# Patient Record
Sex: Female | Born: 1950 | Race: White | Hispanic: No | Smoking: Never smoker
Health system: Southern US, Community
[De-identification: ages and names within clinical notes are randomized; demographics above are authoritative.]

## PROBLEM LIST (undated history)

## (undated) DIAGNOSIS — M1990 Unspecified osteoarthritis, unspecified site: Secondary | ICD-10-CM

## (undated) DIAGNOSIS — C50919 Malignant neoplasm of unspecified site of unspecified female breast: Secondary | ICD-10-CM

## (undated) HISTORY — DX: Unspecified osteoarthritis, unspecified site: M19.90

## (undated) HISTORY — DX: Malignant neoplasm of unspecified site of unspecified female breast: C50.919

---

## 2002-05-06 ENCOUNTER — Emergency Department (HOSPITAL_COMMUNITY): Admission: EM | Admit: 2002-05-06 | Discharge: 2002-05-06 | Payer: Self-pay | Admitting: Emergency Medicine

## 2002-05-18 ENCOUNTER — Emergency Department (HOSPITAL_COMMUNITY): Admission: EM | Admit: 2002-05-18 | Discharge: 2002-05-18 | Payer: Self-pay | Admitting: Emergency Medicine

## 2002-07-19 ENCOUNTER — Encounter: Admission: RE | Admit: 2002-07-19 | Discharge: 2002-10-17 | Payer: Self-pay | Admitting: Family Medicine

## 2002-10-21 ENCOUNTER — Ambulatory Visit (HOSPITAL_COMMUNITY): Admission: RE | Admit: 2002-10-21 | Discharge: 2002-10-21 | Payer: Self-pay | Admitting: Gastroenterology

## 2003-07-07 ENCOUNTER — Other Ambulatory Visit: Admission: RE | Admit: 2003-07-07 | Discharge: 2003-07-07 | Payer: Self-pay | Admitting: Family Medicine

## 2005-06-16 ENCOUNTER — Other Ambulatory Visit: Admission: RE | Admit: 2005-06-16 | Discharge: 2005-06-16 | Payer: Self-pay | Admitting: Family Medicine

## 2006-07-07 ENCOUNTER — Other Ambulatory Visit: Admission: RE | Admit: 2006-07-07 | Discharge: 2006-07-07 | Payer: Self-pay | Admitting: Family Medicine

## 2006-07-23 ENCOUNTER — Encounter: Admission: RE | Admit: 2006-07-23 | Discharge: 2006-07-23 | Payer: Self-pay | Admitting: Family Medicine

## 2008-05-19 ENCOUNTER — Other Ambulatory Visit: Admission: RE | Admit: 2008-05-19 | Discharge: 2008-05-19 | Payer: Self-pay | Admitting: Obstetrics and Gynecology

## 2009-04-08 ENCOUNTER — Encounter: Admission: RE | Admit: 2009-04-08 | Discharge: 2009-04-08 | Payer: Self-pay | Admitting: Radiology

## 2009-04-20 ENCOUNTER — Encounter: Admission: RE | Admit: 2009-04-20 | Discharge: 2009-04-20 | Payer: Self-pay | Admitting: Surgery

## 2009-04-25 ENCOUNTER — Ambulatory Visit (HOSPITAL_BASED_OUTPATIENT_CLINIC_OR_DEPARTMENT_OTHER): Admission: RE | Admit: 2009-04-25 | Discharge: 2009-04-25 | Payer: Self-pay | Admitting: Surgery

## 2009-04-25 HISTORY — PX: BREAST LUMPECTOMY: SHX2

## 2009-05-02 ENCOUNTER — Ambulatory Visit: Payer: Self-pay | Admitting: Oncology

## 2009-05-16 LAB — COMPREHENSIVE METABOLIC PANEL
AST: 19 U/L (ref 0–37)
BUN: 13 mg/dL (ref 6–23)
Calcium: 9.4 mg/dL (ref 8.4–10.5)
Glucose, Bld: 104 mg/dL — ABNORMAL HIGH (ref 70–99)
Sodium: 139 mEq/L (ref 135–145)

## 2009-05-16 LAB — CBC WITH DIFFERENTIAL/PLATELET
BASO%: 0.4 % (ref 0.0–2.0)
Basophils Absolute: 0 10*3/uL (ref 0.0–0.1)
HGB: 14.1 g/dL (ref 11.6–15.9)
MCH: 31.5 pg (ref 25.1–34.0)
MCHC: 34.1 g/dL (ref 31.5–36.0)
MONO#: 0.3 10*3/uL (ref 0.1–0.9)
NEUT%: 61.7 % (ref 38.4–76.8)
WBC: 6.1 10*3/uL (ref 3.9–10.3)
lymph#: 1.8 10*3/uL (ref 0.9–3.3)

## 2009-05-21 ENCOUNTER — Ambulatory Visit: Admission: RE | Admit: 2009-05-21 | Discharge: 2009-05-26 | Payer: Self-pay | Admitting: Radiation Oncology

## 2009-05-30 ENCOUNTER — Ambulatory Visit (HOSPITAL_COMMUNITY): Admission: RE | Admit: 2009-05-30 | Discharge: 2009-05-30 | Payer: Self-pay | Admitting: Oncology

## 2009-06-01 ENCOUNTER — Ambulatory Visit: Payer: Self-pay | Admitting: Oncology

## 2009-06-08 LAB — CBC WITH DIFFERENTIAL/PLATELET
Basophils Absolute: 0 10*3/uL (ref 0.0–0.1)
Eosinophils Absolute: 0.1 10*3/uL (ref 0.0–0.5)
HCT: 39.9 % (ref 34.8–46.6)
MCH: 31.2 pg (ref 25.1–34.0)
MCHC: 34.2 g/dL (ref 31.5–36.0)
MCV: 91.1 fL (ref 79.5–101.0)
NEUT#: 10.3 10*3/uL — ABNORMAL HIGH (ref 1.5–6.5)
NEUT%: 82.2 % — ABNORMAL HIGH (ref 38.4–76.8)
Platelets: 134 10*3/uL — ABNORMAL LOW (ref 145–400)
WBC: 12.5 10*3/uL — ABNORMAL HIGH (ref 3.9–10.3)

## 2009-06-08 LAB — BASIC METABOLIC PANEL
BUN: 11 mg/dL (ref 6–23)
CO2: 22 mEq/L (ref 19–32)
Calcium: 9 mg/dL (ref 8.4–10.5)
Creatinine, Ser: 0.66 mg/dL (ref 0.40–1.20)
Potassium: 3.8 mEq/L (ref 3.5–5.3)
Sodium: 136 mEq/L (ref 135–145)

## 2009-06-08 LAB — TECHNOLOGIST REVIEW

## 2009-06-22 LAB — CBC WITH DIFFERENTIAL/PLATELET
Basophils Absolute: 0 10*3/uL (ref 0.0–0.1)
EOS%: 0 % (ref 0.0–7.0)
MCH: 30.6 pg (ref 25.1–34.0)
MCHC: 34 g/dL (ref 31.5–36.0)
MONO#: 0.2 10*3/uL (ref 0.1–0.9)
MONO%: 1.3 % (ref 0.0–14.0)
NEUT%: 91.9 % — ABNORMAL HIGH (ref 38.4–76.8)

## 2009-06-22 LAB — COMPREHENSIVE METABOLIC PANEL
AST: 18 U/L (ref 0–37)
Albumin: 4.2 g/dL (ref 3.5–5.2)
Chloride: 105 mEq/L (ref 96–112)
Potassium: 4.4 mEq/L (ref 3.5–5.3)
Sodium: 135 mEq/L (ref 135–145)

## 2009-06-22 LAB — URINALYSIS, MICROSCOPIC - CHCC
Bilirubin (Urine): NEGATIVE
Blood: NEGATIVE
Glucose: NEGATIVE g/dL
Ketones: NEGATIVE mg/dL
Leukocyte Esterase: NEGATIVE
Nitrite: NEGATIVE
Specific Gravity, Urine: 1.025 (ref 1.003–1.035)

## 2009-06-29 LAB — CBC WITH DIFFERENTIAL/PLATELET
Basophils Absolute: 0.1 10*3/uL (ref 0.0–0.1)
Eosinophils Absolute: 0.3 10*3/uL (ref 0.0–0.5)
HGB: 13.2 g/dL (ref 11.6–15.9)
MCH: 30.6 pg (ref 25.1–34.0)
MCHC: 33.8 g/dL (ref 31.5–36.0)
MCV: 90.5 fL (ref 79.5–101.0)
MONO#: 1.5 10*3/uL — ABNORMAL HIGH (ref 0.1–0.9)
NEUT#: 5.7 10*3/uL (ref 1.5–6.5)
Platelets: 166 10*3/uL (ref 145–400)
RDW: 14.2 % (ref 11.2–14.5)
lymph#: 1.3 10*3/uL (ref 0.9–3.3)

## 2009-07-11 ENCOUNTER — Ambulatory Visit: Payer: Self-pay | Admitting: Oncology

## 2009-07-13 LAB — CBC WITH DIFFERENTIAL/PLATELET
Basophils Absolute: 0 10*3/uL (ref 0.0–0.1)
Eosinophils Absolute: 0 10*3/uL (ref 0.0–0.5)
HGB: 12.6 g/dL (ref 11.6–15.9)
LYMPH%: 5 % — ABNORMAL LOW (ref 14.0–49.7)
MONO#: 0.2 10*3/uL (ref 0.1–0.9)
NEUT#: 14.1 10*3/uL — ABNORMAL HIGH (ref 1.5–6.5)
RBC: 4.1 10*6/uL (ref 3.70–5.45)
RDW: 15.9 % — ABNORMAL HIGH (ref 11.2–14.5)
WBC: 15 10*3/uL — ABNORMAL HIGH (ref 3.9–10.3)
lymph#: 0.8 10*3/uL — ABNORMAL LOW (ref 0.9–3.3)

## 2009-07-13 LAB — URINALYSIS, MICROSCOPIC - CHCC
Bilirubin (Urine): NEGATIVE
Glucose: NEGATIVE g/dL
Ketones: NEGATIVE mg/dL
Leukocyte Esterase: NEGATIVE
Nitrite: NEGATIVE
Protein: NEGATIVE mg/dL
Specific Gravity, Urine: 1.01 (ref 1.003–1.035)
pH: 6 (ref 4.6–8.0)

## 2009-07-13 LAB — COMPREHENSIVE METABOLIC PANEL WITH GFR
ALT: 36 U/L — ABNORMAL HIGH (ref 0–35)
AST: 27 U/L (ref 0–37)
Albumin: 3.8 g/dL (ref 3.5–5.2)
Alkaline Phosphatase: 65 U/L (ref 39–117)
BUN: 13 mg/dL (ref 6–23)
CO2: 23 meq/L (ref 19–32)
Calcium: 9.1 mg/dL (ref 8.4–10.5)
Chloride: 109 meq/L (ref 96–112)
Creatinine, Ser: 0.56 mg/dL (ref 0.40–1.20)
Glucose, Bld: 155 mg/dL — ABNORMAL HIGH (ref 70–99)
Potassium: 4.2 meq/L (ref 3.5–5.3)
Sodium: 138 meq/L (ref 135–145)
Total Bilirubin: 0.9 mg/dL (ref 0.3–1.2)
Total Protein: 6.2 g/dL (ref 6.0–8.3)

## 2009-07-20 LAB — CBC WITH DIFFERENTIAL/PLATELET
BASO%: 1.1 % (ref 0.0–2.0)
Basophils Absolute: 0.1 10*3/uL (ref 0.0–0.1)
Eosinophils Absolute: 0.2 10*3/uL (ref 0.0–0.5)
HCT: 36.9 % (ref 34.8–46.6)
HGB: 12.4 g/dL (ref 11.6–15.9)
NEUT#: 4.2 10*3/uL (ref 1.5–6.5)
Platelets: 148 10*3/uL (ref 145–400)
RBC: 4.04 10*6/uL (ref 3.70–5.45)
RDW: 15.6 % — ABNORMAL HIGH (ref 11.2–14.5)
WBC: 6.6 10*3/uL (ref 3.9–10.3)

## 2009-08-03 LAB — CBC WITH DIFFERENTIAL/PLATELET
Basophils Absolute: 0 10*3/uL (ref 0.0–0.1)
LYMPH%: 4.2 % — ABNORMAL LOW (ref 14.0–49.7)
MCHC: 33.5 g/dL (ref 31.5–36.0)
MONO#: 0.4 10*3/uL (ref 0.1–0.9)
NEUT#: 12 10*3/uL — ABNORMAL HIGH (ref 1.5–6.5)
Platelets: 242 10*3/uL (ref 145–400)
RDW: 17.6 % — ABNORMAL HIGH (ref 11.2–14.5)

## 2009-08-03 LAB — COMPREHENSIVE METABOLIC PANEL
AST: 24 U/L (ref 0–37)
Alkaline Phosphatase: 62 U/L (ref 39–117)
BUN: 10 mg/dL (ref 6–23)
CO2: 21 mEq/L (ref 19–32)
Calcium: 9.4 mg/dL (ref 8.4–10.5)
Potassium: 4.3 mEq/L (ref 3.5–5.3)
Total Bilirubin: 0.6 mg/dL (ref 0.3–1.2)
Total Protein: 6.4 g/dL (ref 6.0–8.3)

## 2009-08-09 ENCOUNTER — Ambulatory Visit: Admission: RE | Admit: 2009-08-09 | Discharge: 2009-10-29 | Payer: Self-pay | Admitting: Radiation Oncology

## 2009-08-10 ENCOUNTER — Ambulatory Visit: Payer: Self-pay | Admitting: Oncology

## 2009-08-10 LAB — CBC WITH DIFFERENTIAL/PLATELET
EOS%: 2.4 % (ref 0.0–7.0)
HCT: 34.3 % — ABNORMAL LOW (ref 34.8–46.6)
HGB: 11.6 g/dL (ref 11.6–15.9)
MCH: 31.4 pg (ref 25.1–34.0)
MONO%: 24.5 % — ABNORMAL HIGH (ref 0.0–14.0)
NEUT#: 2.1 10*3/uL (ref 1.5–6.5)
Platelets: 121 10*3/uL — ABNORMAL LOW (ref 145–400)

## 2009-08-30 ENCOUNTER — Ambulatory Visit (HOSPITAL_COMMUNITY): Admission: RE | Admit: 2009-08-30 | Discharge: 2009-08-30 | Payer: Self-pay | Admitting: Oncology

## 2009-10-15 ENCOUNTER — Ambulatory Visit: Payer: Self-pay | Admitting: Oncology

## 2009-10-17 LAB — CBC WITH DIFFERENTIAL/PLATELET
BASO%: 0.2 % (ref 0.0–2.0)
Basophils Absolute: 0 10e3/uL (ref 0.0–0.1)
EOS%: 3.1 % (ref 0.0–7.0)
Eosinophils Absolute: 0.1 10e3/uL (ref 0.0–0.5)
HCT: 39.9 % (ref 34.8–46.6)
HGB: 13.8 g/dL (ref 11.6–15.9)
LYMPH%: 16.4 % (ref 14.0–49.7)
MCH: 32.1 pg (ref 25.1–34.0)
MCHC: 34.6 g/dL (ref 31.5–36.0)
MCV: 92.8 fL (ref 79.5–101.0)
MONO#: 0.3 10e3/uL (ref 0.1–0.9)
MONO%: 8.5 % (ref 0.0–14.0)
NEUT#: 2.8 10e3/uL (ref 1.5–6.5)
NEUT%: 71.8 % (ref 38.4–76.8)
Platelets: 200 10e3/uL (ref 145–400)
RBC: 4.3 10e6/uL (ref 3.70–5.45)
RDW: 12.6 % (ref 11.2–14.5)
WBC: 3.9 10e3/uL (ref 3.9–10.3)
lymph#: 0.6 10e3/uL — ABNORMAL LOW (ref 0.9–3.3)

## 2009-10-17 LAB — COMPREHENSIVE METABOLIC PANEL
AST: 25 U/L (ref 0–37)
Alkaline Phosphatase: 57 U/L (ref 39–117)
Creatinine, Ser: 0.58 mg/dL (ref 0.40–1.20)
Glucose, Bld: 98 mg/dL (ref 70–99)
Total Bilirubin: 0.7 mg/dL (ref 0.3–1.2)
Total Protein: 6.5 g/dL (ref 6.0–8.3)

## 2009-11-13 LAB — CBC WITH DIFFERENTIAL/PLATELET
Basophils Absolute: 0 10*3/uL (ref 0.0–0.1)
EOS%: 4.5 % (ref 0.0–7.0)
HCT: 41.1 % (ref 34.8–46.6)
HGB: 14.3 g/dL (ref 11.6–15.9)
LYMPH%: 19.6 % (ref 14.0–49.7)
RBC: 4.49 10*6/uL (ref 3.70–5.45)
RDW: 12.5 % (ref 11.2–14.5)
lymph#: 0.7 10*3/uL — ABNORMAL LOW (ref 0.9–3.3)

## 2009-11-13 LAB — COMPREHENSIVE METABOLIC PANEL
ALT: 18 U/L (ref 0–35)
Albumin: 4.4 g/dL (ref 3.5–5.2)
CO2: 25 mEq/L (ref 19–32)
Calcium: 9.6 mg/dL (ref 8.4–10.5)
Chloride: 107 mEq/L (ref 96–112)
Creatinine, Ser: 0.7 mg/dL (ref 0.40–1.20)
Potassium: 4.3 mEq/L (ref 3.5–5.3)
Sodium: 141 mEq/L (ref 135–145)

## 2009-12-19 ENCOUNTER — Other Ambulatory Visit: Admission: RE | Admit: 2009-12-19 | Discharge: 2009-12-19 | Payer: Self-pay | Admitting: Family Medicine

## 2010-02-11 ENCOUNTER — Ambulatory Visit: Payer: Self-pay | Admitting: Oncology

## 2010-03-27 ENCOUNTER — Ambulatory Visit: Payer: Self-pay | Admitting: Oncology

## 2010-04-02 ENCOUNTER — Ambulatory Visit: Payer: Self-pay | Admitting: Oncology

## 2010-04-02 LAB — CBC WITH DIFFERENTIAL/PLATELET
BASO%: 0.3 % (ref 0.0–2.0)
Basophils Absolute: 0 10*3/uL (ref 0.0–0.1)
Eosinophils Absolute: 0.2 10*3/uL (ref 0.0–0.5)
HGB: 13.2 g/dL (ref 11.6–15.9)
LYMPH%: 22.8 % (ref 14.0–49.7)
MCV: 92.6 fL (ref 79.5–101.0)
MONO%: 6.9 % (ref 0.0–14.0)
NEUT%: 66.2 % (ref 38.4–76.8)
Platelets: 204 10*3/uL (ref 145–400)
lymph#: 1.1 10*3/uL (ref 0.9–3.3)

## 2010-04-02 LAB — COMPREHENSIVE METABOLIC PANEL
ALT: 24 U/L (ref 0–35)
AST: 24 U/L (ref 0–37)
Albumin: 3.9 g/dL (ref 3.5–5.2)
Alkaline Phosphatase: 68 U/L (ref 39–117)
BUN: 12 mg/dL (ref 6–23)
CO2: 27 mEq/L (ref 19–32)
Calcium: 9 mg/dL (ref 8.4–10.5)
Chloride: 107 mEq/L (ref 96–112)
Creatinine, Ser: 0.7 mg/dL (ref 0.40–1.20)
Glucose, Bld: 90 mg/dL (ref 70–99)
Potassium: 3.8 mEq/L (ref 3.5–5.3)
Sodium: 140 mEq/L (ref 135–145)
Total Bilirubin: 0.5 mg/dL (ref 0.3–1.2)
Total Protein: 6.2 g/dL (ref 6.0–8.3)

## 2010-04-02 LAB — VITAMIN D 25 HYDROXY (VIT D DEFICIENCY, FRACTURES): Vit D, 25-Hydroxy: 14 ng/mL — ABNORMAL LOW (ref 30–89)

## 2010-04-02 LAB — CANCER ANTIGEN 27.29: CA 27.29: 30 U/mL (ref 0–39)

## 2010-05-27 ENCOUNTER — Ambulatory Visit: Payer: No Typology Code available for payment source | Attending: Radiation Oncology | Admitting: Radiation Oncology

## 2010-06-16 LAB — URINALYSIS, ROUTINE W REFLEX MICROSCOPIC
Glucose, UA: NEGATIVE mg/dL
Ketones, ur: NEGATIVE mg/dL
Nitrite: NEGATIVE
Specific Gravity, Urine: 1.022 (ref 1.005–1.030)
pH: 6 (ref 5.0–8.0)

## 2010-06-16 LAB — CBC
Hemoglobin: 14.4 g/dL (ref 12.0–15.0)
MCHC: 34.8 g/dL (ref 30.0–36.0)
MCV: 91.6 fL (ref 78.0–100.0)
Platelets: 198 10*3/uL (ref 150–400)
RDW: 13.1 % (ref 11.5–15.5)
WBC: 5.4 10*3/uL (ref 4.0–10.5)

## 2010-06-16 LAB — URINE MICROSCOPIC-ADD ON

## 2010-06-16 LAB — COMPREHENSIVE METABOLIC PANEL
AST: 25 U/L (ref 0–37)
BUN: 10 mg/dL (ref 6–23)
CO2: 25 mEq/L (ref 19–32)
Calcium: 9 mg/dL (ref 8.4–10.5)
GFR calc Af Amer: >60 mL/min (ref >60)
Glucose, Bld: 110 mg/dL — ABNORMAL HIGH (ref 70–99)
Potassium: 4.5 mEq/L (ref 3.5–5.1)
Total Protein: 6.7 g/dL (ref 6.0–8.3)

## 2010-06-16 LAB — DIFFERENTIAL
Basophils Absolute: 0 10*3/uL (ref 0.0–0.1)
Basophils Relative: 1 % (ref 0–1)
Eosinophils Absolute: 0.1 10*3/uL (ref 0.0–0.7)
Monocytes Relative: 6 % (ref 3–12)
Neutro Abs: 3.4 10*3/uL (ref 1.7–7.7)
Neutrophils Relative %: 63 % (ref 43–77)

## 2010-08-16 NOTE — Op Note (Signed)
   NAME:  Holly Woodard, Holly Woodard                       ACCOUNT NO.:  [id number]   MEDICAL RECORD NO.:  [id number]                   PATIENT TYPE:  AMB   LOCATION:  ENDO                                 FACILITY:  [facility]   PHYSICIAN:  Graylin Shiver, M.D.                DATE OF BIRTH:  Jan 16, 1951   DATE OF PROCEDURE:  10/21/2002  DATE OF DISCHARGE:                                 OPERATIVE REPORT   PROCEDURE:  Colonoscopy.   INDICATIONS FOR PROCEDURE:  Screening.   CONSENT:  Informed consent was obtained after explanation of the risks of  bleeding, infection and perforation.   PREMEDICATION:  Fentanyl 50 mcg IV, Versed 4 mg IV.   DESCRIPTION OF PROCEDURE:  With the patient in the left lateral decubitus  position, the rectal exam was performed and no masses were felt on digital  rectal examination.  The Olympus colonoscope was inserted into the rectum  and advanced around the colon to the cecum.  Cecal landmarks were  identified.  The cecum and ascending colon were normal.  The transverse  colon was normal.  The descending colon, sigmoid and rectum were normal.  She tolerated the procedure well without complications.   IMPRESSION:  Normal colonoscopy to the cecum with presence of some external  hemorrhoids.                                                Graylin Shiver, M.D.    SFG/MEDQ  D:  10/21/2002  T:  10/21/2002  Job:  [id number]   cc:   Chales Salmon. Abigail Miyamoto, M.D.  [address]  [place]  [place] [address]  Fax: [phone]

## 2010-10-28 ENCOUNTER — Other Ambulatory Visit: Payer: Self-pay | Admitting: Oncology

## 2010-10-28 ENCOUNTER — Encounter (HOSPITAL_BASED_OUTPATIENT_CLINIC_OR_DEPARTMENT_OTHER): Payer: PRIVATE HEALTH INSURANCE | Admitting: Oncology

## 2010-10-28 DIAGNOSIS — Z170 Estrogen receptor positive status [ER+]: Secondary | ICD-10-CM

## 2010-10-28 DIAGNOSIS — Z5111 Encounter for antineoplastic chemotherapy: Secondary | ICD-10-CM

## 2010-10-28 DIAGNOSIS — B370 Candidal stomatitis: Secondary | ICD-10-CM

## 2010-10-28 DIAGNOSIS — C50419 Malignant neoplasm of upper-outer quadrant of unspecified female breast: Secondary | ICD-10-CM

## 2010-10-28 LAB — CBC WITH DIFFERENTIAL/PLATELET
Eosinophils Absolute: 0.1 10*3/uL (ref 0.0–0.5)
HCT: 38 % (ref 34.8–46.6)
LYMPH%: 23.2 % (ref 14.0–49.7)
MONO#: 0.3 10*3/uL (ref 0.1–0.9)
NEUT#: 3.1 10*3/uL (ref 1.5–6.5)
Platelets: 192 10*3/uL (ref 145–400)
RBC: 4.13 10*6/uL (ref 3.70–5.45)
WBC: 4.6 10*3/uL (ref 3.9–10.3)

## 2010-10-28 LAB — COMPREHENSIVE METABOLIC PANEL
Albumin: 4.2 g/dL (ref 3.5–5.2)
CO2: 20 mEq/L (ref 19–32)
Calcium: 9.2 mg/dL (ref 8.4–10.5)
Glucose, Bld: 94 mg/dL (ref 70–99)
Sodium: 138 mEq/L (ref 135–145)
Total Bilirubin: 0.5 mg/dL (ref 0.3–1.2)
Total Protein: 6.5 g/dL (ref 6.0–8.3)

## 2010-12-11 ENCOUNTER — Other Ambulatory Visit: Payer: Self-pay | Admitting: Dermatology

## 2011-03-10 ENCOUNTER — Telehealth: Payer: Self-pay | Admitting: *Deleted

## 2011-03-10 NOTE — Telephone Encounter (Signed)
patient confirmed over the phone the new date and time on 05-08-2011 starting at 8:30am

## 2011-03-16 IMAGING — CR DG CHEST 2V
2 series · 2 of 2 positions shown · non-contrast
Comparison: 04/20/2009

CLINICAL DATA: Shortness of breath, history of breast cancer

CHEST - 2 VIEW

[w chest pa]
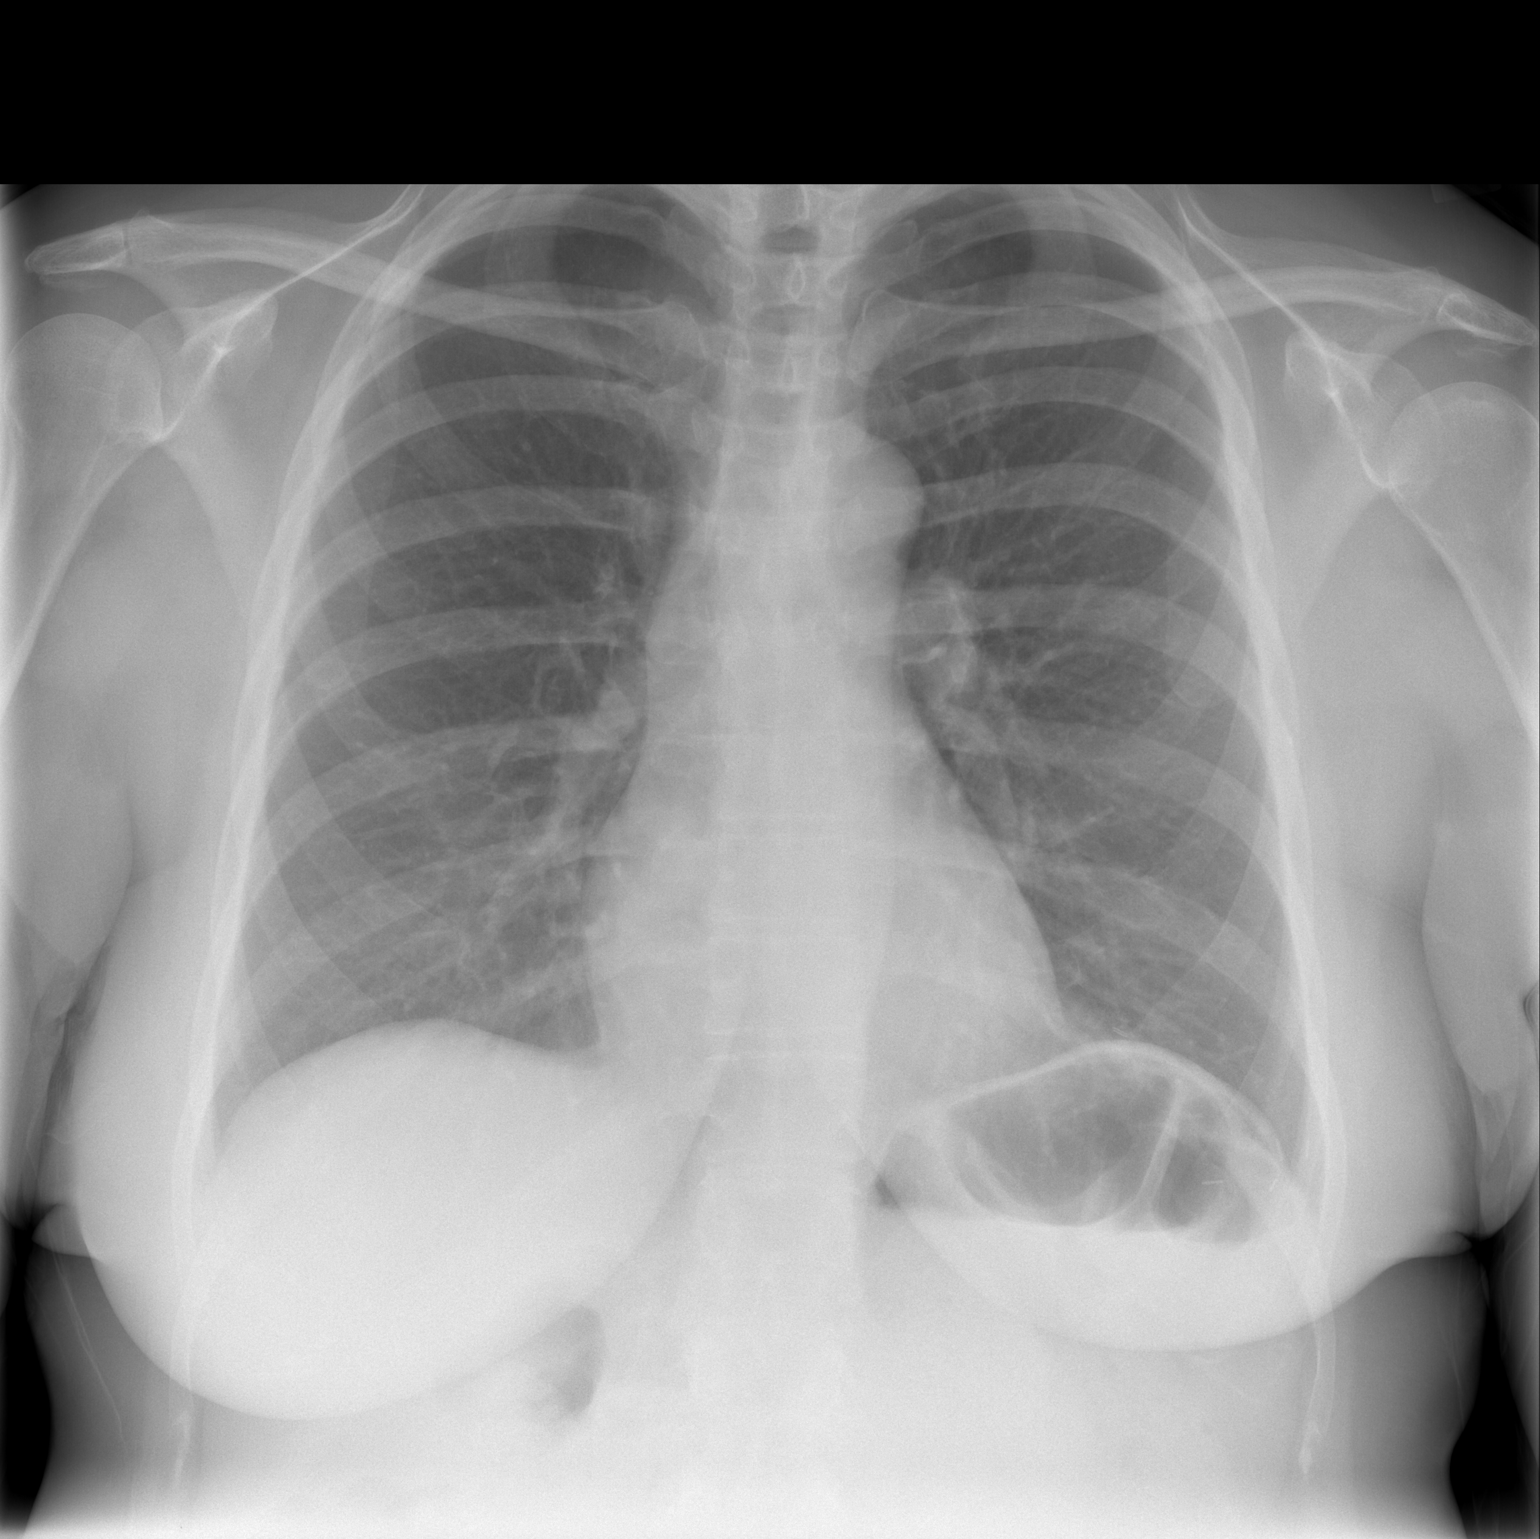

[w chest lat]
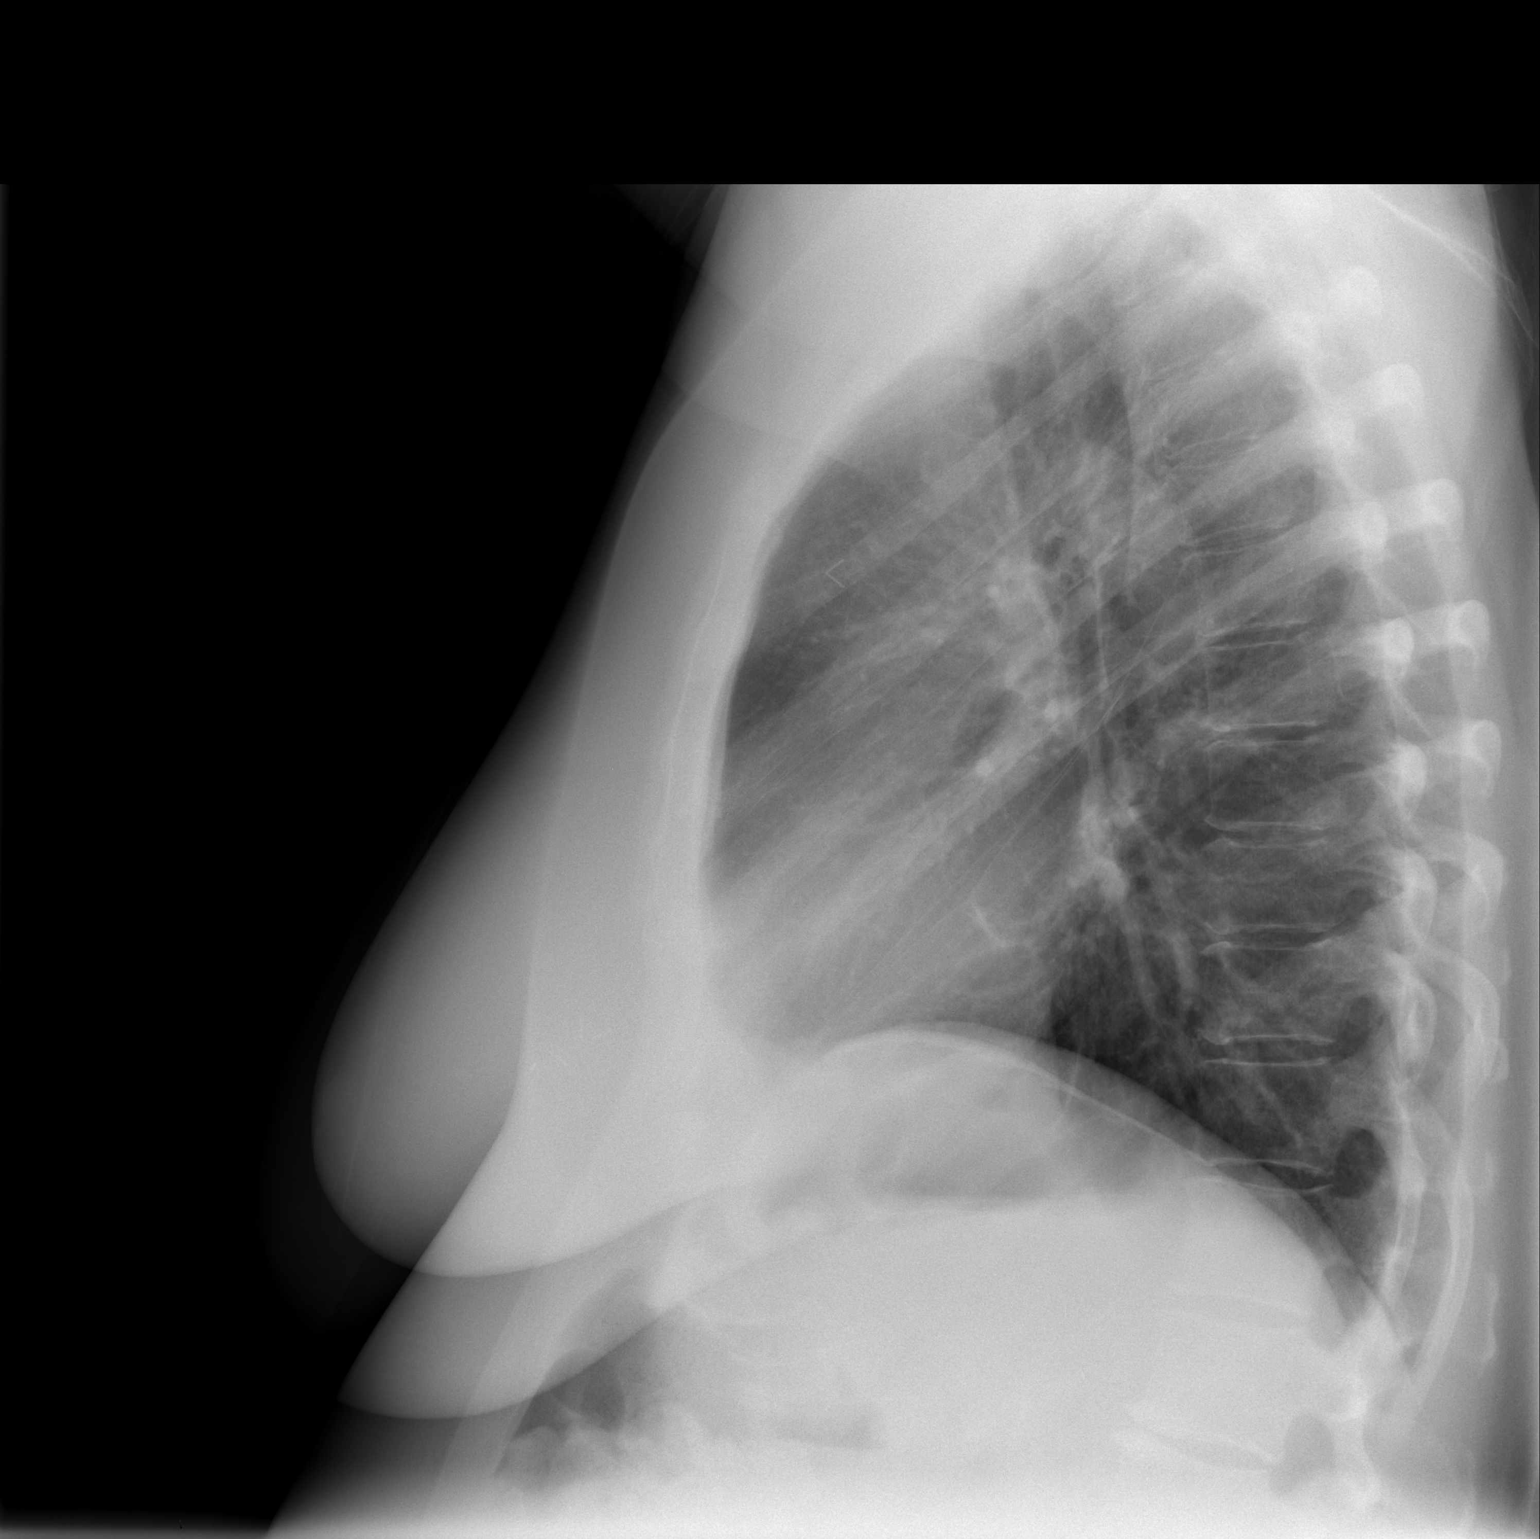

[2 of 2 positions shown; findings below may reference images not displayed]

FINDINGS: Normal heart size, mediastinal contours, and pulmonary vascularity.
Lungs clear.
Bones unremarkable.
No pneumothorax.
Few surgical clips noted at left breast / axilla
IMPRESSION: No acute abnormalities.

## 2011-04-30 ENCOUNTER — Ambulatory Visit: Payer: PRIVATE HEALTH INSURANCE | Admitting: Oncology

## 2011-04-30 ENCOUNTER — Other Ambulatory Visit: Payer: PRIVATE HEALTH INSURANCE | Admitting: Lab

## 2011-05-08 ENCOUNTER — Ambulatory Visit (HOSPITAL_BASED_OUTPATIENT_CLINIC_OR_DEPARTMENT_OTHER): Payer: BC Managed Care – PPO | Admitting: Oncology

## 2011-05-08 ENCOUNTER — Other Ambulatory Visit (HOSPITAL_BASED_OUTPATIENT_CLINIC_OR_DEPARTMENT_OTHER): Payer: BC Managed Care – PPO | Admitting: Lab

## 2011-05-08 VITALS — BP 169/90 | HR 75 | Temp 99.0°F | Ht 65.5 in | Wt 223.0 lb

## 2011-05-08 DIAGNOSIS — E559 Vitamin D deficiency, unspecified: Secondary | ICD-10-CM

## 2011-05-08 DIAGNOSIS — C50919 Malignant neoplasm of unspecified site of unspecified female breast: Secondary | ICD-10-CM

## 2011-05-08 DIAGNOSIS — Z170 Estrogen receptor positive status [ER+]: Secondary | ICD-10-CM

## 2011-05-08 NOTE — Progress Notes (Signed)
Hematology and Oncology Follow Up Visit  JENNIFERANN STUCKERT [id number] 1950-05-12 61 y.o. 05/08/2011 9:22 AM PCP [facility] [place]  Principle Diagnosis: 61 year old history of T1 C. N0 breast cancer ER/PR positive status post 4 cycles of TC chemotherapy radiation therapy completed 10/24/2009  Interim History:  There have been no intercurrent illness, hospitalizations or medication changes.  Medications: I have reviewed the patient's current medications.  Allergies: No Known Allergies  Past Medical History, Surgical history, Social history, and Family History were reviewed and updated.  Review of Systems: Constitutional:  Negative for fever, chills, night sweats, anorexia, weight loss, pain. Cardiovascular: no chest pain or dyspnea on exertion Respiratory: no cough, shortness of breath, or wheezing Neurological: no TIA or stroke symptoms Dermatological: negative ENT: negative Skin Gastrointestinal: negative Genito-Urinary: negative Hematological and Lymphatic: negative Breast: negative Musculoskeletal: migratory arthralgias Remaining ROS negative.  Physical Exam: Blood pressure 169/90, pulse 75, temperature 99 F (37.2 C), temperature source Oral, height 5' 5.5" (1.664 m), weight 223 lb (101.152 kg). ECOG: 0 General appearance: alert, cooperative and appears stated age Head: Normocephalic, without obvious abnormality, atraumatic Neck: no adenopathy, no carotid bruit, no JVD, supple, symmetrical, trachea midline and thyroid not enlarged, symmetric, no tenderness/mass/nodules Lymph nodes: Cervical, supraclavicular, and axillary nodes normal. Cardiac : regular rate and rhythm, no murmurs or gallops Pulmonary:clear to auscultation bilaterally and normal percussion bilaterally Breasts: inspection negative, no nipple discharge or bleeding, no masses or nodularity palpable Abdomen:soft, non-tender; bowel sounds normal; no masses,  no organomegaly Extremities  negative Neuro: alert, oriented, normal speech, no focal findings or movement disorder noted  Lab Results: Lab Results  Component Value Date   WBC 4.6 10/28/2010   HGB 13.1 10/28/2010   HCT 38.0 10/28/2010   MCV 92.1 10/28/2010   PLT 192 10/28/2010     Chemistry      Component Value Date/Time   NA 138 10/28/2010 1053   NA 138 10/28/2010 1053   K 4.2 10/28/2010 1053   K 4.2 10/28/2010 1053   CL 106 10/28/2010 1053   CL 106 10/28/2010 1053   CO2 20 10/28/2010 1053   CO2 20 10/28/2010 1053   BUN 16 10/28/2010 1053   BUN 16 10/28/2010 1053   CREATININE 0.63 10/28/2010 1053   CREATININE 0.63 10/28/2010 1053      Component Value Date/Time   CALCIUM 9.2 10/28/2010 1053   CALCIUM 9.2 10/28/2010 1053   ALKPHOS 68 10/28/2010 1053   ALKPHOS 68 10/28/2010 1053   AST 21 10/28/2010 1053   AST 21 10/28/2010 1053   ALT 16 10/28/2010 1053   ALT 16 10/28/2010 1053   BILITOT 0.5 10/28/2010 1053   BILITOT 0.5 10/28/2010 1053      .pathology. Radiological Studies: chest X-ray n/a Mammogram 1/13-wnl Bone density Due 2/13  Impression and Plan: Patient is doing well. I will see her in 6 months time for followup. I have or arrange for a bone density test to be performed this month.  More than 50% of the visit was spent in patient-related counselling   Pierce Crane, MD 2/7/20139:22 AM

## 2011-05-23 ENCOUNTER — Encounter: Payer: Self-pay | Admitting: Oncology

## 2011-11-07 ENCOUNTER — Telehealth: Payer: Self-pay | Admitting: *Deleted

## 2011-11-07 ENCOUNTER — Ambulatory Visit: Payer: BC Managed Care – PPO | Admitting: Oncology

## 2011-11-07 ENCOUNTER — Other Ambulatory Visit: Payer: BC Managed Care – PPO | Admitting: Lab

## 2011-11-07 NOTE — Telephone Encounter (Signed)
patient  confirmed over the phone the new date and time on 12-2011

## 2011-12-23 ENCOUNTER — Other Ambulatory Visit: Payer: Self-pay | Admitting: Oncology

## 2011-12-23 DIAGNOSIS — C50919 Malignant neoplasm of unspecified site of unspecified female breast: Secondary | ICD-10-CM

## 2011-12-31 ENCOUNTER — Ambulatory Visit (HOSPITAL_BASED_OUTPATIENT_CLINIC_OR_DEPARTMENT_OTHER): Payer: BC Managed Care – PPO | Admitting: Oncology

## 2011-12-31 ENCOUNTER — Other Ambulatory Visit (HOSPITAL_BASED_OUTPATIENT_CLINIC_OR_DEPARTMENT_OTHER): Payer: BC Managed Care – PPO | Admitting: Lab

## 2011-12-31 ENCOUNTER — Telehealth: Payer: Self-pay | Admitting: *Deleted

## 2011-12-31 VITALS — BP 141/88 | HR 54 | Temp 98.0°F | Resp 20 | Ht 65.5 in | Wt 209.6 lb

## 2011-12-31 DIAGNOSIS — E559 Vitamin D deficiency, unspecified: Secondary | ICD-10-CM

## 2011-12-31 DIAGNOSIS — C50919 Malignant neoplasm of unspecified site of unspecified female breast: Secondary | ICD-10-CM

## 2011-12-31 DIAGNOSIS — Z170 Estrogen receptor positive status [ER+]: Secondary | ICD-10-CM

## 2011-12-31 MED ORDER — ANASTROZOLE 1 MG PO TABS: 1.0000 mg | ORAL_TABLET | Freq: Every day | ORAL | Status: DC

## 2011-12-31 NOTE — Telephone Encounter (Signed)
Gave patient appointment for 07-01-2012 at 8:30am

## 2011-12-31 NOTE — Progress Notes (Signed)
Hematology and Oncology Follow Up Visit  LAURAL EILAND [id number] [birth year] 61 y.o. 12/31/2011 2:08 PM PCP [facility] [place]  Principle Diagnosis: 61 year old history of T1 C. N0 breast cancer ER/PR positive status post 4 cycles of TC chemotherapy radiation therapy completed 10/24/2009 on arimidex. Interim History:  There have been no intercurrent illness, hospitalizations or medication changes.she is due for a mammogram in 1/14 as well as a bone density, she is tolerating Arimidex well. Medications: I have reviewed the patient's current medications.  Allergies: No Known Allergies  Past Medical History, Surgical history, Social history, and Family History were reviewed and updated.  Review of Systems: Constitutional:  Negative for fever, chills, night sweats, anorexia, weight loss, pain. Cardiovascular: no chest pain or dyspnea on exertion Respiratory: no cough, shortness of breath, or wheezing Neurological: no TIA or stroke symptoms Dermatological: negative ENT: negative Skin Gastrointestinal: negative Genito-Urinary: negative Hematological and Lymphatic: negative Breast: negative Musculoskeletal: migratory arthralgias Remaining ROS negative.  Physical Exam: Blood pressure 141/88, pulse 54, temperature 98 F (36.7 C), resp. rate 20, height 5' 5.5" (1.664 m), weight 209 lb 9.6 oz (95.074 kg). ECOG: 0 General appearance: alert, cooperative and appears stated age Head: Normocephalic, without obvious abnormality, atraumatic Neck: no adenopathy, no carotid bruit, no JVD, supple, symmetrical, trachea midline and thyroid not enlarged, symmetric, no tenderness/mass/nodules Lymph nodes: Cervical, supraclavicular, and axillary nodes normal. Cardiac : regular rate and rhythm, no murmurs or gallops Pulmonary:clear to auscultation bilaterally and normal percussion bilaterally Breasts: inspection negative, no nipple discharge or bleeding, no masses or nodularity  palpable Abdomen:soft, non-tender; bowel sounds normal; no masses,  no organomegaly Extremities negative Neuro: alert, oriented, normal speech, no focal findings or movement disorder noted  Lab Results: Lab Results  Component Value Date   WBC 4.6 10/28/2010   HGB 13.1 10/28/2010   HCT 38.0 10/28/2010   MCV 92.1 10/28/2010   PLT 192 10/28/2010     Chemistry      Component Value Date/Time   NA 138 10/28/2010 1053   NA 138 10/28/2010 1053   K 4.2 10/28/2010 1053   K 4.2 10/28/2010 1053   CL 106 10/28/2010 1053   CL 106 10/28/2010 1053   CO2 20 10/28/2010 1053   CO2 20 10/28/2010 1053   BUN 16 10/28/2010 1053   BUN 16 10/28/2010 1053   CREATININE 0.63 10/28/2010 1053   CREATININE 0.63 10/28/2010 1053      Component Value Date/Time   CALCIUM 9.2 10/28/2010 1053   CALCIUM 9.2 10/28/2010 1053   ALKPHOS 68 10/28/2010 1053   ALKPHOS 68 10/28/2010 1053   AST 21 10/28/2010 1053   AST 21 10/28/2010 1053   ALT 16 10/28/2010 1053   ALT 16 10/28/2010 1053   BILITOT 0.5 10/28/2010 1053   BILITOT 0.5 10/28/2010 1053      .pathology. Radiological Studies: chest X-ray n/a Mammogram 1/13-wnl Bone density Due 2/13  Impression and Plan: Patient is doing well. I will see her in 6 months time for followup. I have or arrange for a bone density test to be performed this month.  More than 50% of the visit was spent in patient-related counselling   Pierce Crane, MD 10/2/20132:08 PM

## 2012-01-01 LAB — VITAMIN D 25 HYDROXY (VIT D DEFICIENCY, FRACTURES): Vit D, 25-Hydroxy: 36 ng/mL (ref 30–89)

## 2012-05-11 ENCOUNTER — Telehealth: Payer: Self-pay | Admitting: *Deleted

## 2012-05-11 NOTE — Telephone Encounter (Signed)
Left message for a return phone call to reschedule her appt.  

## 2012-05-17 ENCOUNTER — Telehealth: Payer: Self-pay | Admitting: *Deleted

## 2012-05-17 NOTE — Telephone Encounter (Signed)
Received call back from patient to reschedule her appt. Confirmed appt. For 07/07/12 at 0900 with Jacquie Hunter,NP.  Then will become Dr.Magrinat.

## 2012-06-07 ENCOUNTER — Encounter: Payer: Self-pay | Admitting: *Deleted

## 2012-06-07 ENCOUNTER — Encounter: Payer: Self-pay | Admitting: Oncology

## 2012-06-07 NOTE — Progress Notes (Signed)
Mailed letter & calendar to pt. 

## 2012-07-01 ENCOUNTER — Ambulatory Visit: Payer: BC Managed Care – PPO | Admitting: Oncology

## 2012-07-01 ENCOUNTER — Other Ambulatory Visit: Payer: BC Managed Care – PPO | Admitting: Lab

## 2012-07-07 ENCOUNTER — Ambulatory Visit (HOSPITAL_BASED_OUTPATIENT_CLINIC_OR_DEPARTMENT_OTHER): Payer: BC Managed Care – PPO | Admitting: Family

## 2012-07-07 ENCOUNTER — Telehealth: Payer: Self-pay | Admitting: *Deleted

## 2012-07-07 ENCOUNTER — Encounter: Payer: Self-pay | Admitting: Family

## 2012-07-07 VITALS — BP 153/87 | HR 65 | Temp 98.6°F | Resp 20 | Ht 65.0 in | Wt 218.5 lb

## 2012-07-07 DIAGNOSIS — C50919 Malignant neoplasm of unspecified site of unspecified female breast: Secondary | ICD-10-CM

## 2012-07-07 DIAGNOSIS — C50912 Malignant neoplasm of unspecified site of left female breast: Secondary | ICD-10-CM

## 2012-07-07 DIAGNOSIS — Z170 Estrogen receptor positive status [ER+]: Secondary | ICD-10-CM

## 2012-07-07 DIAGNOSIS — R0602 Shortness of breath: Secondary | ICD-10-CM

## 2012-07-07 MED ORDER — ANASTROZOLE 1 MG PO TABS: 1.0000 mg | ORAL_TABLET | Freq: Every day | ORAL | Status: DC

## 2012-07-07 NOTE — Telephone Encounter (Signed)
gv appt for [facility] on 01/06/13 and [facility] for a bone density. email Hunter because [facility] does not complete chest xray, and waiting on her response.

## 2012-07-07 NOTE — Patient Instructions (Addendum)
Please contact us at [phone] if you have any questions or concerns.  Continue taking vitamin d daily  Eat a balanced diet, drink plenty of water, exercise daily and get plenty of rest

## 2012-07-07 NOTE — Progress Notes (Signed)
[facility]  [phone] [phone]  OFFICE PROGRESS NOTE   ID: Holly Woodard   DOB: 24-Jun-1950  MR#: [id number]  JYN#:[id number]   PCP: Gaye Alken, MD SU: Currie Paris, MD OTHER MD: Billie Lade, MD   HISTORY OF PRESENT ILLNESS: From Dr. Theron Arista Rubin's new patient evaluation note dated 05/16/2009: "Holly Woodard is here today with a friend of hers for evaluation and discussion of breast cancer.  This is a delightful 62 year old woman from [facility] referred by Dr. Jamey Ripa for evaluation and treatment of breast cancer.  This woman has been in good health.  She has really no chronic medical problems.  She takes glucosamine on a chronic basis and that is about it.  She does undergo annual screening mammography.  She had a mammogram on 03/08/2009.  This showed asymmetry in the left breast.  Additional views were recommended.  On 03/15/2009, she had a diagnostic mammogram and ultrasound.  The left breast ultrasound showed a hypoechoic mass measuring 1.6 x 1.2 x 1.4 cm perpendicular to the chest wall.  No other abnormalities were seen.  A biopsy was recommended.  A biopsy took place.  Subsequently, a BSGI scan on 04/03/2009 was also performed, which showed a solitary area of abnormality.  MRI scan of both breasts 04/08/2009 showed a spiculated mass in the lateral aspect of the left breast measuring 1.8 x 1.1 x 0.7 cm.  The biopsy of that was performed, which showed this to be an invasive ductal cancer, ER and PR positive at 99% and 51% respectively, HER-2 was not over amplified, Ki-67 20%.  The patient underwent a lumpectomy and sentinel lymph node evaluation 04/25/2009.  Final pathology 1.7 cm grade 2 of 3 invasive ductal cancer.  All resection margins clear.  Four lymph nodes all negative for malignancy.  The patient has had an unremarkable postoperative course."  Her subsequent history is as detailed below.  INTERVAL HISTORY: Dr. Darnelle Catalan and  I saw Ms. Holly Woodard today for followup of left breast invasive ductal carcinoma.  The patient was last seen by Dr. Donnie Coffin on  12/31/2011.  Since her last office visit, the patient has been doing relatively well.  She is establishing herself with Dr. Darrall Dears service today.   REVIEW OF SYSTEMS: A 10 point review of systems was completed and is negative except minor joint aches and discomfort in her hips and knees, right breast tenderness, one episode dizziness and shortness of breath with exertion for the past 1-2 months.  The patient has noticed that she has begun more mouth breathing.  The patient denies any other symptomatology.   PAST MEDICAL HISTORY: Past Medical History  Diagnosis Date  . Breast cancer   . Arthritis     Degenerative disk     PAST SURGICAL HISTORY: Past Surgical History  Procedure Laterality Date  . Breast lumpectomy Left 04/25/2009     FAMILY HISTORY Family History  Problem Relation Age of Onset  . Diabetes Mother   . Dementia Mother   . Heart attack Mother   . Cancer Father     Lung CA  . Hypertension Father   . Hyperlipidemia Father   . Cancer Sister 54    DCIS  Father is alive and well, age 6.  Mother is deceased with complications of pneumonia and diabetes.  One sister and one brother both live in [place].  Sister diagnosed in 2003 with DCIS status post lumpectomy and radiation and no adjuvant hormonal therapy.  Brother  is in good health.  No other history of breast or ovarian cancer in the family.   GYNECOLOGIC HISTORY: G1, P1, menarche age 70, menopausal from few years, no history of hormone replacement therapy.   SOCIAL HISTORY: The patient was married for 14 years, divorced for over 10 years.  She has an adult son, living in [place].  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 Smoking status: Never Smoker   .  Smokeless tobacco: Never Used  . Alcohol Use: Yes     Comment: Rarely/Social    Colonoscopy: Not on file PAP: 12/19/2009 Bone density: Not on file Lipid panel: Not on file  No Known Allergies  Current Outpatient Prescriptions  Medication Sig Dispense Refill  . anastrozole (ARIMIDEX) 1 MG tablet Take 1 tablet (1 mg total) by mouth daily.  90 tablet  8  . Vitamin D, Ergocalciferol, (DRISDOL) 50000 UNITS CAPS Take 5,000 Units by mouth 2 (two) times a week.       No current facility-administered medications for this visit.    OBJECTIVE: Filed Vitals:   07/07/12 0852  BP: 153/87  Pulse: 65  Temp: 98.6 F (37 C)  Resp: 20     Body mass index is 36.36 kg/(m^2).     02 saturation: 96% on room air  ECOG FS:  Grade 1 - Symptomatic, but fully ambulatory                General appearance: Alert, cooperative, well nourished, no apparent distress Head: Normocephalic, without obvious abnormality, atraumatic,  Eyes: Conjunctivae/corneas clear, PERRLA, EOMI, eyebrows are absent Nose: Nares, septum and mucosa are normal, no drainage or sinus tenderness Neck: Excessive habitus, supple, symmetrical, trachea midline, thyroid not enlarged, no tenderness Resp: Clear to auscultation bilaterally Cardio: Regular rate and rhythm, S1, S2 normal, no murmur, click, rub or gallop Breasts: Pendulous bilaterally, left breast and axilla area have well-healed surgical scars, no lymphadenopathy, no nipple inversion, no axilla fullness GI: Soft, excessive habitus, distended, non-tender, hypoactive bowel sounds Extremities: Extremities normal, atraumatic, no cyanosis or edema Lymph nodes: Cervical, supraclavicular, and axillary nodes normal Neurologic: Grossly normal   LAB RESULTS: Lab Results  Component Value Date   WBC 4.6 10/28/2010   NEUTROABS 3.1 10/28/2010   HGB 13.1 10/28/2010   HCT 38.0 10/28/2010   MCV 92.1 10/28/2010   PLT 192 10/28/2010      Chemistry      Component Value Date/Time   NA  138 10/28/2010 1053   K 4.2 10/28/2010 1053   CL 106 10/28/2010 1053   CO2 20 10/28/2010 1053   BUN 16 10/28/2010 1053   CREATININE 0.63 10/28/2010 1053      Component Value Date/Time   CALCIUM 9.2 10/28/2010 1053   ALKPHOS 68 10/28/2010 1053   AST 21 10/28/2010 1053   ALT 16 10/28/2010 1053   BILITOT 0.5 10/28/2010 1053       Lab Results  Component Value Date   LABCA2 28 10/28/2010    Urinalysis    Component Value Date/Time   COLORURINE YELLOW 04/20/2009 0830   APPEARANCEUR HAZY* 04/20/2009 0830   LABSPEC 1.010 07/13/2009 1233   LABSPEC 1.022 04/20/2009 0830   PHURINE 6.0 04/20/2009 0830   GLUCOSEU NEGATIVE 04/20/2009 0830   HGBUR NEGATIVE 04/20/2009 0830   BILIRUBINUR NEGATIVE 04/20/2009 0830   KETONESUR NEGATIVE 04/20/2009 0830   PROTEINUR NEGATIVE 04/20/2009 0830  UROBILINOGEN 0.2 04/20/2009 0830   NITRITE NEGATIVE 04/20/2009 0830   LEUKOCYTESUR MODERATE* 04/20/2009 0830    STUDIES: No results found.  ASSESSMENT: 62 y.[place]. [place], [place]:  1.  Status post left breast needle core biopsy including biopsy of medial and lateral areas on 04/03/2009 which showed invasive mammary carcinoma with lobular features, ER 99%, PR 51%, Ki-67 20%.  2.  Status post left breast lumpectomy with sentinel node biopsy on 04/25/2009 for a stage I, pT1c, pN0, 1.7 cm invasive ductal carcinoma of the left breast, grade 2, ER 99%, PR 51%, Ki-67 20%, HER-2/neu negative, with 0/5 positive lymph nodes.  3.  On 05/16/2009 the patient received a Oncotype score of 27 with average rate of distant recurrence at 18%.  4.  Status post adjuvant chemotherapy with Taxotere/Cytoxan from 06/01/2009 through 08/03/2009 x 4 cycles.  5.  Status post radiation therapy from 03/31/2009 through 10/24/2009.  6.  The patient started antiestrogen therapy with Arimidex in 10/2009.  7.  Shortness of breath with exertion.  PLAN: 1.  Dr. Darnelle Catalan explained to the patient that she has a 90% chance of the breast cancer  not reoccurring.  We plan for the patient to continue on antiestrogen therapy with Arimidex until 10/2014.  An electronic prescription for Arimidex 1 mg by mouth daily #90 with 8 refills was sent to the patient's pharmacy.  The patient had a bilateral digital diagnostic mammogram on 04/14/2012 which showed stable architectural distortion at the lumpectomy site on the left with no new or worrisome findings seen in either breast.  The patient will be due for another bilateral digital diagnostic mammogram in 03/2013.  We will schedule the patient for a bone density examination.  The patient was asked to consider taking vitamin D3 1000 IUs by mouth daily.  The patient states that she has laboratories drawn by her primary care physician and will forward the laboratory results to our office.  2.  For the patient's shortness of breath with exertion, we will send her for a two-view chest x-ray as soon as possible.  3.  We plan to see the patient again in 6 months (12/2012) at which time we will check a CBC, CMP, LDH and vitamin D level.  All questions were answered.  The patient was encouraged to contact us in the interim with any problems, questions or concerns.   Larina Bras, NP-C 07/10/2012, 2:20 PM

## 2012-07-13 ENCOUNTER — Other Ambulatory Visit: Payer: Self-pay | Admitting: Family

## 2012-07-13 ENCOUNTER — Telehealth: Payer: Self-pay | Admitting: *Deleted

## 2012-07-13 DIAGNOSIS — Z853 Personal history of malignant neoplasm of breast: Secondary | ICD-10-CM

## 2012-07-13 NOTE — Telephone Encounter (Signed)
Lm stating that [facility] does not do chest xray. Due to pt wanted a one stop shop therefore [facility] had place an order with [facility]. Made pt aware that she can come and have an cxr completed here @ [place].

## 2012-07-14 ENCOUNTER — Ambulatory Visit (HOSPITAL_COMMUNITY)
Admission: RE | Admit: 2012-07-14 | Discharge: 2012-07-14 | Disposition: A | Discharge status: Home / Self Care | Payer: BC Managed Care – PPO | Source: Ambulatory Visit | Attending: Family | Admitting: Family

## 2012-07-14 DIAGNOSIS — Z853 Personal history of malignant neoplasm of breast: Secondary | ICD-10-CM | POA: Insufficient documentation

## 2012-07-14 DIAGNOSIS — R0602 Shortness of breath: Secondary | ICD-10-CM | POA: Insufficient documentation

## 2012-07-14 DIAGNOSIS — R030 Elevated blood-pressure reading, without diagnosis of hypertension: Secondary | ICD-10-CM | POA: Insufficient documentation

## 2012-07-16 ENCOUNTER — Telehealth: Payer: Self-pay

## 2012-07-16 NOTE — Telephone Encounter (Signed)
Spoke with pt regarding results of CXR. Negative per [facility]. Pt voiced understanding and knows to call the office with any questions. TMB

## 2013-01-06 ENCOUNTER — Other Ambulatory Visit (HOSPITAL_BASED_OUTPATIENT_CLINIC_OR_DEPARTMENT_OTHER): Payer: BC Managed Care – PPO | Admitting: Lab

## 2013-01-06 ENCOUNTER — Telehealth: Payer: Self-pay | Admitting: *Deleted

## 2013-01-06 ENCOUNTER — Ambulatory Visit (HOSPITAL_BASED_OUTPATIENT_CLINIC_OR_DEPARTMENT_OTHER): Payer: BC Managed Care – PPO | Admitting: Oncology

## 2013-01-06 VITALS — BP 142/84 | HR 69 | Temp 98.4°F | Resp 18 | Ht 65.0 in | Wt 204.2 lb

## 2013-01-06 DIAGNOSIS — C50912 Malignant neoplasm of unspecified site of left female breast: Secondary | ICD-10-CM

## 2013-01-06 DIAGNOSIS — C50919 Malignant neoplasm of unspecified site of unspecified female breast: Secondary | ICD-10-CM

## 2013-01-06 DIAGNOSIS — Z170 Estrogen receptor positive status [ER+]: Secondary | ICD-10-CM

## 2013-01-06 DIAGNOSIS — C50412 Malignant neoplasm of upper-outer quadrant of left female breast: Secondary | ICD-10-CM

## 2013-01-06 LAB — CBC WITH DIFFERENTIAL/PLATELET
Basophils Absolute: 0 10*3/uL (ref 0.0–0.1)
EOS%: 2.2 % (ref 0.0–7.0)
HCT: 40.9 % (ref 34.8–46.6)
HGB: 14 g/dL (ref 11.6–15.9)
LYMPH%: 19.7 % (ref 14.0–49.7)
MCH: 31.1 pg (ref 25.1–34.0)
MCV: 90.6 fL (ref 79.5–101.0)
MONO%: 7.1 % (ref 0.0–14.0)
NEUT%: 70.2 % (ref 38.4–76.8)
Platelets: 226 10*3/uL (ref 145–400)

## 2013-01-06 LAB — COMPREHENSIVE METABOLIC PANEL (CC13)
Anion Gap: 10 mEq/L (ref 3–11)
CO2: 22 mEq/L (ref 22–29)
Creatinine: 0.7 mg/dL (ref 0.6–1.1)
Glucose: 111 mg/dl (ref 70–140)
Sodium: 141 mEq/L (ref 136–145)
Total Bilirubin: 0.71 mg/dL (ref 0.20–1.20)
Total Protein: 6.8 g/dL (ref 6.4–8.3)

## 2013-01-06 MED ORDER — ANASTROZOLE 1 MG PO TABS: 1.0000 mg | ORAL_TABLET | Freq: Every day | ORAL | Status: DC

## 2013-01-06 NOTE — Telephone Encounter (Signed)
appts made and printed...td 

## 2013-01-06 NOTE — Progress Notes (Signed)
[facility]  Telephone:([phone]) [phone] [phone]  OFFICE PROGRESS NOTE   ID: Holly Woodard   DOB: 05/27/1950  MR#: [id number]  ONG#:[id number]   PCP: Holly Labella, MD SU: Holly Paris, MD OTHER MD: Holly Lade, MD   HISTORY OF PRESENT ILLNESS: From Dr. Theron Arista Woodard's new patient evaluation note dated 05/16/2009: "Holly Woodard is here today with a friend of hers for evaluation and discussion of breast cancer.  This is a delightful 62 year old woman from [place] referred by Dr. Jamey Woodard for evaluation and treatment of breast cancer.  This woman has been in good health.  She has really no chronic medical problems.  She takes glucosamine on a chronic basis and that is about it.  She does undergo annual screening mammography.  She had a mammogram on 03/08/2009.  This showed asymmetry in the left breast.  Additional views were recommended.  On 03/15/2009, she had a diagnostic mammogram and ultrasound.  The left breast ultrasound showed a hypoechoic mass measuring 1.6 x 1.2 x 1.4 cm perpendicular to the chest wall.  No other abnormalities were seen.  A biopsy was recommended.  A biopsy took place.  Subsequently, a BSGI scan on 04/03/2009 was also performed, which showed a solitary area of abnormality.  MRI scan of both breasts 04/08/2009 showed a spiculated mass in the lateral aspect of the left breast measuring 1.8 x 1.1 x 0.7 cm.  The biopsy of that was performed, which showed this to be an invasive ductal cancer, ER and PR positive at 99% and 51% respectively, HER-2 was not over amplified, Ki-67 20%.  The patient underwent a lumpectomy and sentinel lymph node evaluation 04/25/2009.  Final pathology 1.7 cm grade 2 of 3 invasive ductal cancer.  All resection margins clear.  Four lymph nodes all negative for malignancy.  The patient has had an unremarkable postoperative course."  Her subsequent history is as detailed below.  INTERVAL HISTORY: And for followup of her  breast cancer. Interval history is significant for her son having married a presently and woman. The patient went to [place] for the wedding. She is very excited about this development and is following him closely through a phase work   REVIEW OF SYSTEMS: She is tolerating the anastrozole with no significant side effects and in particular no hot flashes, vaginal dryness or arthralgias/myalgias. She has noted some hair thinning, but she tells me this was present before she started the medication and does run in her family. She has some urinary stress incontinence which is not a new problem. Over the last 2 months she has noted a very slight tremor independent he of the right hand. This comes and goes. She does drink coffee twice daily and we discussed her possibly cutting back to one cup a day. Otherwise she will let us know if this progresses. The patient is exercising by walking 20 minutes 2 or 3 times a week "on a good week". A detailed review of systems today was otherwise entirely noncontributory.   PAST MEDICAL HISTORY: Past Medical History  Diagnosis Date  . Breast cancer   . Arthritis     Degenerative disk     PAST SURGICAL HISTORY: Past Surgical History  Procedure Laterality Date  . Breast lumpectomy Left 04/25/2009     FAMILY HISTORY Family History  Problem Relation Age of Onset  . Diabetes Mother   . Dementia Mother   . Heart attack Mother   . Cancer Father     Lung  CA  . Hypertension Father   . Hyperlipidemia Father   . Cancer Sister 52    DCIS  Father is alive and well, age 29.  Mother is deceased with complications of pneumonia and diabetes.  One sister and one brother both live in [place].  Sister diagnosed in 2003 with DCIS status post lumpectomy and radiation and no adjuvant hormonal therapy.  Brother is in good health.  No other history of breast or ovarian cancer in the family.   GYNECOLOGIC HISTORY: G1, P1, menarche age 42, menopausal from few years, no history  of hormone replacement therapy.   SOCIAL HISTORY: The patient was married for 14 years, divorced for over 10 years.  She has an adult son, living in [place].  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 Smoking status: Never Smoker   . Smokeless tobacco: Never Used  . Alcohol Use: Yes     Comment: Rarely/Social    Colonoscopy: Not on file PAP: 12/19/2009 Bone density: April 2014/normal Lipid panel: Not on file  No Known Allergies  Current Outpatient Prescriptions  Medication Sig Dispense Refill  . anastrozole (ARIMIDEX) 1 MG tablet Take 1 tablet (1 mg total) by mouth daily.  90 tablet  8  . Vitamin D, Ergocalciferol, (DRISDOL) 50000 UNITS CAPS Take 5,000 Units by mouth 2 (two) times a week.       No current facility-administered medications for this visit.    OBJECTIVE: Middle-aged white woman who appears well Filed Vitals:   01/06/13 0819  BP: 142/84  Pulse: 69  Temp: 98.4 F (36.9 C)  Resp: 18     Body mass index is 33.98 kg/(m^2).      ECOG FS: 0          Sclerae unicteric Oropharynx clear No cervical or supraclavicular adenopathy Lungs no rales or rhonchi Heart regular rate and rhythm Abd soft, obese, nontender, positive bowel sounds MSK no focal spinal tenderness, no peripheral edema Neuro: nonfocal, well oriented, positive affect Breasts: The right breast is unremarkable; the left breast is status post lumpectomy. The incision is healing very nicely. The cosmetic result is good. There is no evidence of local recurrence. The left axilla is benign.   LAB RESULTS: Lab Results  Component Value Date   WBC 5.1 01/06/2013   NEUTROABS 3.6 01/06/2013   HGB 14.0 01/06/2013   HCT 40.9 01/06/2013   MCV 90.6 01/06/2013   PLT 226 01/06/2013      Chemistry      Component Value Date/Time   NA 141 01/06/2013 0807   NA 138 10/28/2010 1053   K  4.3 01/06/2013 0807   K 4.2 10/28/2010 1053   CL 106 10/28/2010 1053   CO2 22 01/06/2013 0807   CO2 20 10/28/2010 1053   BUN 11.4 01/06/2013 0807   BUN 16 10/28/2010 1053   CREATININE 0.7 01/06/2013 0807   CREATININE 0.63 10/28/2010 1053      Component Value Date/Time   CALCIUM 9.2 01/06/2013 0807   CALCIUM 9.2 10/28/2010 1053   ALKPHOS 86 01/06/2013 0807   ALKPHOS 68 10/28/2010 1053   AST 17 01/06/2013 0807   AST 21 10/28/2010 1053   ALT 20 01/06/2013 0807   ALT 16 10/28/2010 1053   BILITOT 0.71 01/06/2013 0807   BILITOT 0.5 10/28/2010 1053       Lab Results  Component Value Date  LABCA2 28 10/28/2010    Urinalysis    Component Value Date/Time   COLORURINE YELLOW 04/20/2009 0830   APPEARANCEUR HAZY* 04/20/2009 0830   LABSPEC 1.010 07/13/2009 1233   LABSPEC 1.022 04/20/2009 0830   PHURINE 6.0 04/20/2009 0830   GLUCOSEU NEGATIVE 04/20/2009 0830   HGBUR NEGATIVE 04/20/2009 0830   BILIRUBINUR NEGATIVE 04/20/2009 0830   KETONESUR NEGATIVE 04/20/2009 0830   PROTEINUR NEGATIVE 04/20/2009 0830   UROBILINOGEN 0.2 04/20/2009 0830   NITRITE NEGATIVE 04/20/2009 0830   LEUKOCYTESUR MODERATE* 04/20/2009 0830    STUDIES: Bone density April 2014 was normal  ASSESSMENT: 62 y.o. [place] woman:   1.  Status post left breast lumpectomy with sentinel node biopsy 04/25/2009 for a  pT1c, pN0, stage IA invasive ductal carcinoma, grade 2, ER 99%, PR 51%, Ki-67 20%, HER-2/neu negative  2. Oncotype score of 27 predicted an average rate of distant recurrence at 10 years of 18% if the patient's only systemic therapy was tamoxifen for 5 years.  3.  Status post adjuvant chemotherapy with docetaxel and cyclophosphamide x4 given between 06/01/2009 and 08/03/2009 .  4.  Status post adjuvant radiation to the left breast from 03/31/2009 through 10/24/2009.  6.  started antiestrogen therapy with anastrozole in August of 2011; bone density 07/20/2012 was normal.  PLAN: She is tolerating the anastrozole well, and with  a normal bone density we are not going to switch to tamoxifen. The standard of care is still 5 years of her aromatase inhibitor, which means she will complete her treatments August of 2016. She will see [place] again July of 2015 and then I can see her again July or August of 2016, for a final visit before "graduation"  The only suggestion I have for her is to increase her exercise to 45 minutes 5 times a week. She is aware of the benefits of that. She knows to call for any problems that may develop before her next visit here.  Lowella Dell, MD  01/06/2013, 8:49 AM

## 2013-01-07 NOTE — Addendum Note (Signed)
Addended by: Billey Co on: 01/07/2013 05:30 PM   Modules accepted: Orders

## 2013-03-09 ENCOUNTER — Other Ambulatory Visit (HOSPITAL_COMMUNITY)
Admission: RE | Admit: 2013-03-09 | Discharge: 2013-03-09 | Disposition: A | Discharge status: Home / Self Care | Payer: BC Managed Care – PPO | Source: Ambulatory Visit | Attending: Family Medicine | Admitting: Family Medicine

## 2013-03-09 ENCOUNTER — Other Ambulatory Visit: Payer: Self-pay | Admitting: Family Medicine

## 2013-03-09 DIAGNOSIS — Z124 Encounter for screening for malignant neoplasm of cervix: Secondary | ICD-10-CM | POA: Insufficient documentation

## 2013-03-09 DIAGNOSIS — Z1151 Encounter for screening for human papillomavirus (HPV): Secondary | ICD-10-CM | POA: Insufficient documentation

## 2013-07-14 ENCOUNTER — Emergency Department (HOSPITAL_COMMUNITY)
Admission: EM | Admit: 2013-07-14 | Discharge: 2013-07-14 | Disposition: A | Discharge status: Home / Self Care | Payer: BC Managed Care – PPO | Attending: Emergency Medicine | Admitting: Emergency Medicine

## 2013-07-14 ENCOUNTER — Encounter (HOSPITAL_COMMUNITY): Payer: Self-pay | Admitting: Emergency Medicine

## 2013-07-14 DIAGNOSIS — Z853 Personal history of malignant neoplasm of breast: Secondary | ICD-10-CM | POA: Insufficient documentation

## 2013-07-14 DIAGNOSIS — S0191XA Laceration without foreign body of unspecified part of head, initial encounter: Secondary | ICD-10-CM

## 2013-07-14 DIAGNOSIS — S0180XA Unspecified open wound of other part of head, initial encounter: Secondary | ICD-10-CM | POA: Insufficient documentation

## 2013-07-14 DIAGNOSIS — W19XXXA Unspecified fall, initial encounter: Secondary | ICD-10-CM

## 2013-07-14 DIAGNOSIS — Z79899 Other long term (current) drug therapy: Secondary | ICD-10-CM | POA: Insufficient documentation

## 2013-07-14 DIAGNOSIS — Y9301 Activity, walking, marching and hiking: Secondary | ICD-10-CM | POA: Insufficient documentation

## 2013-07-14 DIAGNOSIS — Y929 Unspecified place or not applicable: Secondary | ICD-10-CM | POA: Insufficient documentation

## 2013-07-14 DIAGNOSIS — Z8739 Personal history of other diseases of the musculoskeletal system and connective tissue: Secondary | ICD-10-CM | POA: Insufficient documentation

## 2013-07-14 DIAGNOSIS — W1809XA Striking against other object with subsequent fall, initial encounter: Secondary | ICD-10-CM | POA: Insufficient documentation

## 2013-07-14 DIAGNOSIS — W010XXA Fall on same level from slipping, tripping and stumbling without subsequent striking against object, initial encounter: Secondary | ICD-10-CM | POA: Insufficient documentation

## 2013-07-14 MED ORDER — TETANUS-DIPHTH-ACELL PERTUSSIS 5-2.5-18.5 LF-MCG/0.5 IM SUSP
0.5000 mL | Freq: Once | INTRAMUSCULAR | Status: DC
  Filled 2013-07-14: qty 0.5

## 2013-07-14 NOTE — ED Notes (Signed)
Patient walking up the stairs, tripped, fell forward and hit head on stairs.  No loss of conciousness, no neck or back pain.  Laceration to forehead.

## 2013-07-14 NOTE — ED Notes (Signed)
Bed: MM38 Expected date: 07/14/13 Expected time:  Means of arrival:  Comments: [facility]

## 2013-07-14 NOTE — Discharge Instructions (Signed)
WOUND CARE Please have your stitches/staples removed in 7 days or sooner if you have concerns. You may do this at any available urgent care or at your primary care doctor's office.  Keep area clean and dry for 24 hours. Do not remove bandage, if applied.  After 24 hours, remove bandage and wash wound gently with mild soap and warm water. Reapply a new bandage after cleaning wound, if directed.  Continue daily cleansing with soap and water until stitches/staples are removed.  Do not apply any ointments or creams to the wound while stitches/staples are in place, as this may cause delayed healing.  Seek medical careif you experience any of the following signs of infection: Swelling, redness, pus drainage, streaking, fever >101.0 F  Seek care if you experience excessive bleeding that does not stop after 15-20 minutes of constant, firm pressure.  Scar Minimization You will have a scar anytime you have surgery and a cut is made in the skin or you have something removed from your skin (mole, skin cancer, cyst). Although scars are unavoidable following surgery, there are ways to minimize their appearance. It is important to follow all the instructions you receive from your caregiver about wound care. How your wound heals will influence the appearance of your scar. If you do not follow the wound care instructions as directed, complications such as infection may occur. Wound instructions include keeping the wound clean, moist, and not letting the wound form a scab. Some people form scars that are raised and lumpy (hypertrophic) or larger than the initial wound (keloidal). HOME CARE INSTRUCTIONS   Follow wound care instructions as directed.  Keep the wound clean by washing it with soap and water.  Keep the wound moist with provided antibiotic cream or petroleum jelly until completely healed. Moisten twice a day for about 2 weeks.  Get stitches (sutures) taken out at the scheduled  time.  Avoid touching or manipulating your wound unless needed. Wash your hands thoroughly before and after touching your wound.  Follow all restrictions such as limits on exercise or work. This depends on where your scar is located.  Keep the scar protected from sunburn. Cover the scar with sunscreen/sunblock with SPF 30 or higher.  Gently massage the scar using a circular motion to help minimize the appearance of the scar. Do this only after the wound has closed and all the sutures have been removed.  For hypertrophic or keloidal scars, there are several ways to treat and minimize their appearance. Methods include compression therapy, intralesional corticosteroids, laser therapy, or surgery. These methods are performed by your caregiver. Remember that the scar may appear lighter or darker than your normal skin color. This difference in color should even out with time. SEEK MEDICAL CARE IF:   You have a fever.  You develop signs of infection such as pain, redness, pus, and warmth.  You have questions or concerns. Document Released: 09/04/2009 Document Revised: 06/09/2011 Document Reviewed: 09/04/2009 [facility] Patient Information 2014 [place]. Facial Laceration  A facial laceration is a cut on the face. These injuries can be painful and cause bleeding. Lacerations usually heal quickly, but they need special care to reduce scarring. DIAGNOSIS  Your health care provider will take a medical history, ask for details about how the injury occurred, and examine the wound to determine how deep the cut is. TREATMENT  Some facial lacerations may not require closure. Others may not be able to be closed because of an increased risk of infection. The risk  of infection and the chance for successful closure will depend on various factors, including the amount of time since the injury occurred. The wound may be cleaned to help prevent infection. If closure is appropriate, pain medicines may be  given if needed. Your health care provider will use stitches (sutures), wound glue (adhesive), or skin adhesive strips to repair the laceration. These tools bring the skin edges together to allow for faster healing and a better cosmetic outcome. If needed, you may also be given a tetanus shot. HOME CARE INSTRUCTIONS  Only take over-the-counter or prescription medicines as directed by your health care provider.  Follow your health care provider's instructions for wound care. These instructions will vary depending on the technique used for closing the wound. For Sutures:  Keep the wound clean and dry.   If you were given a bandage (dressing), you should change it at least once a day. Also change the dressing if it becomes wet or dirty, or as directed by your health care provider.   Wash the wound with soap and water 2 times a day. Rinse the wound off with water to remove all soap. Pat the wound dry with a clean towel.   After cleaning, apply a thin layer of the antibiotic ointment recommended by your health care provider. This will help prevent infection and keep the dressing from sticking.   You may shower as usual after the first 24 hours. Do not soak the wound in water until the sutures are removed.   Get your sutures removed as directed by your health care provider. With facial lacerations, sutures should usually be taken out after 4 5 days to avoid stitch marks.   Wait a few days after your sutures are removed before applying any makeup. For Skin Adhesive Strips:  Keep the wound clean and dry.   Do not get the skin adhesive strips wet. You may bathe carefully, using caution to keep the wound dry.   If the wound gets wet, pat it dry with a clean towel.   Skin adhesive strips will fall off on their own. You may trim the strips as the wound heals. Do not remove skin adhesive strips that are still stuck to the wound. They will fall off in time.  For Wound Adhesive:  You may  briefly wet your wound in the shower or bath. Do not soak or scrub the wound. Do not swim. Avoid periods of heavy sweating until the skin adhesive has fallen off on its own. After showering or bathing, gently pat the wound dry with a clean towel.   Do not apply liquid medicine, cream medicine, ointment medicine, or makeup to your wound while the skin adhesive is in place. This may loosen the film before your wound is healed.   If a dressing is placed over the wound, be careful not to apply tape directly over the skin adhesive. This may cause the adhesive to be pulled off before the wound is healed.   Avoid prolonged exposure to sunlight or tanning lamps while the skin adhesive is in place.  The skin adhesive will usually remain in place for 5 10 days, then naturally fall off the skin. Do not pick at the adhesive film.  After Healing: Once the wound has healed, cover the wound with sunscreen during the day for 1 full year. This can help minimize scarring. Exposure to ultraviolet light in the first year will darken the scar. It can take 1 2 years for the scar  to lose its redness and to heal completely.  SEEK IMMEDIATE MEDICAL CARE IF:  You have redness, pain, or swelling around the wound.   You see ayellowish-white fluid (pus) coming from the wound.   You have chills or a fever.  MAKE SURE YOU:  Understand these instructions.  Will watch your condition.  Will get help right away if you are not doing well or get worse. Document Released: 04/24/2004 Document Revised: 01/05/2013 Document Reviewed: 10/28/2012 [facility] Patient Information 2014 [place], [place].

## 2013-07-14 NOTE — ED Provider Notes (Signed)
CSN: [id number]     Arrival date & time 07/14/13  1736 History   First MD Initiated Contact with Patient 07/14/13 1748     Chief Complaint  Patient presents with  . Fall     (Consider location/radiation/quality/duration/timing/severity/associated sxs/prior Treatment) HPI Comments: pateint to ED via EMS after mechanical fall and head laceration. Pateit states she was walking up the stairs, tripped and fell hittin her forehead against the edge of a step. She had a 6 cm laceration to the forehead. Tdap utd. No LOC or syncope  Patient is a 63 y.o. female presenting with scalp laceration. The history is provided by the patient and medical records.  Head Laceration This is a new problem. The current episode started today. Pertinent negatives include no abdominal pain, anorexia, arthralgias, change in bowel habit, chest pain, chills, congestion, coughing, diaphoresis, fatigue, fever, headaches, joint swelling, myalgias, nausea, neck pain, numbness, rash, sore throat, swollen glands, urinary symptoms, vertigo, visual change, vomiting or weakness. Nothing aggravates the symptoms. She has tried nothing for the symptoms.     Past Medical History  Diagnosis Date  . Breast cancer   . Arthritis     Degenerative disk    Past Surgical History  Procedure Laterality Date  . Breast lumpectomy Left 04/25/2009   Family History  Problem Relation Age of Onset  . Diabetes Mother   . Dementia Mother   . Heart attack Mother   . Cancer Father     Lung CA  . Hypertension Father   . Hyperlipidemia Father   . Cancer Sister 68    DCIS   History  Substance Use Topics  . Smoking status: Never Smoker   . Smokeless tobacco: Never Used  . Alcohol Use: Yes     Comment: Rarely/Social   OB History   Grav Para Term Preterm Abortions TAB SAB Ect Mult Living                 Review of Systems  Constitutional: Negative for fever, chills, diaphoresis and fatigue.  HENT: Negative for congestion and sore  throat.   Respiratory: Negative for cough.   Cardiovascular: Negative for chest pain.  Gastrointestinal: Negative for nausea, vomiting, abdominal pain, anorexia and change in bowel habit.  Musculoskeletal: Negative for arthralgias, joint swelling, myalgias and neck pain.  Skin: Negative for rash.  Neurological: Negative for vertigo, weakness, numbness and headaches.      Allergies  Review of patient's allergies indicates not on file.  Home Medications   Prior to Admission medications   Medication Sig Start Date End Date Taking? Authorizing Provider  anastrozole (ARIMIDEX) 1 MG tablet Take 1 tablet (1 mg total) by mouth daily. 01/06/13   Chauncey Cruel, MD  cholecalciferol (VITAMIN D) 1000 UNITS tablet Take 2,000 Units by mouth every other day.    Historical Provider, MD   BP 151/80  Pulse 68  Temp(Src) 98.5 F (36.9 C) (Oral)  Resp 20  SpO2 100% Physical Exam  Constitutional: She is oriented to person, place, and time. She appears well-developed and well-nourished. No distress.  HENT:  Head: Normocephalic.  6 cm laceation to the forehead  Eyes: Conjunctivae are normal. No scleral icterus.  Neck: Normal range of motion.  Cardiovascular: Normal rate, regular rhythm and normal heart sounds.  Exam reveals no gallop and no friction rub.   No murmur heard. Pulmonary/Chest: Effort normal and breath sounds normal. No respiratory distress.  Abdominal: Soft. Bowel sounds are normal. She exhibits no distension and no  mass. There is no tenderness. There is no guarding.  Neurological: She is alert and oriented to person, place, and time.  Skin: Skin is warm and dry. She is not diaphoretic.    ED Course  Procedures (including critical care time) Labs Review Labs Reviewed - No data to display  Imaging Review No results found.   EKG Interpretation None     LACERATION REPAIR Performed by: Margarita Mail Authorized by: Margarita Mail Consent: Verbal consent obtained. Risks  and benefits: risks, benefits and alternatives were discussed Consent given by: patient Patient identity confirmed: provided demographic data Prepped and Draped in normal sterile fashion Wound explored  Laceration Location: forehead  Laceration Length: 6 cm  No Foreign Bodies seen or palpated  Anesthesia: local infiltration  Local anesthetic: lidocaine 2 % w epinephrine  Anesthetic total: 8 ml  Irrigation method: syringe Amount of cleaning: standard  Skin closure: 5.0 prolene   Number of sutures: 6  Technique: SI  Patient tolerance: Patient tolerated the procedure well with no immediate complications.   MDM   Final diagnoses:  Fall  Laceration of head    MDM Number of Diagnoses or Management Options Fall:  Laceration of head:  Tdap booster given.Pressure irrigation performed. Laceration occurred < 8 hours prior to repair which was well tolerated. Pt has no co morbidities to effect normal wound healing. Discussed suture home care w pt and answered questions. Pt to f-u for wound check and suture removal in 7 days. Pt is hemodynamically stable w no complaints prior to dc.       Margarita Mail, PA-C 07/18/13 0131

## 2013-07-21 NOTE — ED Provider Notes (Signed)
Medical screening examination/treatment/procedure(s) were performed by non-physician practitioner and as supervising physician I was immediately available for consultation/collaboration.    Dot Lanes, MD 07/21/13 [phone]

## 2013-10-06 ENCOUNTER — Telehealth: Payer: Self-pay | Admitting: *Deleted

## 2013-10-06 NOTE — Telephone Encounter (Signed)
Notified pt of appointment and provider change.

## 2013-10-12 ENCOUNTER — Other Ambulatory Visit: Payer: Self-pay | Admitting: *Deleted

## 2013-10-12 DIAGNOSIS — C50412 Malignant neoplasm of upper-outer quadrant of left female breast: Secondary | ICD-10-CM

## 2013-10-13 ENCOUNTER — Ambulatory Visit (HOSPITAL_BASED_OUTPATIENT_CLINIC_OR_DEPARTMENT_OTHER): Payer: BC Managed Care – PPO | Admitting: Hematology

## 2013-10-13 ENCOUNTER — Encounter: Payer: Self-pay | Admitting: Hematology

## 2013-10-13 ENCOUNTER — Other Ambulatory Visit (HOSPITAL_BASED_OUTPATIENT_CLINIC_OR_DEPARTMENT_OTHER): Payer: BC Managed Care – PPO

## 2013-10-13 ENCOUNTER — Other Ambulatory Visit: Payer: Self-pay | Admitting: *Deleted

## 2013-10-13 ENCOUNTER — Other Ambulatory Visit: Payer: BC Managed Care – PPO

## 2013-10-13 VITALS — BP 143/69 | HR 62 | Temp 98.4°F | Resp 20 | Ht 65.0 in | Wt 211.5 lb

## 2013-10-13 DIAGNOSIS — C50919 Malignant neoplasm of unspecified site of unspecified female breast: Secondary | ICD-10-CM

## 2013-10-13 DIAGNOSIS — Z170 Estrogen receptor positive status [ER+]: Secondary | ICD-10-CM

## 2013-10-13 DIAGNOSIS — C50912 Malignant neoplasm of unspecified site of left female breast: Secondary | ICD-10-CM

## 2013-10-13 DIAGNOSIS — C50412 Malignant neoplasm of upper-outer quadrant of left female breast: Secondary | ICD-10-CM

## 2013-10-13 LAB — COMPREHENSIVE METABOLIC PANEL (CC13)
ALK PHOS: 87 U/L (ref 40–150)
ALT: 24 U/L (ref 0–55)
AST: 22 U/L (ref 5–34)
Albumin: 4 g/dL (ref 3.5–5.0)
Anion Gap: 9 mEq/L (ref 3–11)
BILIRUBIN TOTAL: 0.66 mg/dL (ref 0.20–1.20)
BUN: 12.5 mg/dL (ref 7.0–26.0)
CO2: 23 mEq/L (ref 22–29)
CREATININE: 0.7 mg/dL (ref 0.6–1.1)
Calcium: 9.6 mg/dL (ref 8.4–10.4)
Chloride: 108 mEq/L (ref 98–109)
GLUCOSE: 98 mg/dL (ref 70–140)
Potassium: 4.2 mEq/L (ref 3.5–5.1)
Sodium: 140 mEq/L (ref 136–145)
Total Protein: 6.8 g/dL (ref 6.4–8.3)

## 2013-10-13 LAB — CBC WITH DIFFERENTIAL/PLATELET
BASO%: 1.1 % (ref 0.0–2.0)
BASOS ABS: 0.1 10*3/uL (ref 0.0–0.1)
EOS ABS: 0.1 10*3/uL (ref 0.0–0.5)
EOS%: 1.8 % (ref 0.0–7.0)
HCT: 42.7 % (ref 34.8–46.6)
HEMOGLOBIN: 14.2 g/dL (ref 11.6–15.9)
LYMPH%: 24 % (ref 14.0–49.7)
MCH: 30.4 pg (ref 25.1–34.0)
MCHC: 33.2 g/dL (ref 31.5–36.0)
MCV: 91.7 fL (ref 79.5–101.0)
MONO#: 0.4 10*3/uL (ref 0.1–0.9)
MONO%: 6 % (ref 0.0–14.0)
NEUT%: 67.1 % (ref 38.4–76.8)
NEUTROS ABS: 4 10*3/uL (ref 1.5–6.5)
PLATELETS: 240 10*3/uL (ref 145–400)
RBC: 4.65 10*6/uL (ref 3.70–5.45)
RDW: 13.1 % (ref 11.2–14.5)
WBC: 5.9 10*3/uL (ref 3.9–10.3)
lymph#: 1.4 10*3/uL (ref 0.9–3.3)

## 2013-10-13 MED ORDER — ANASTROZOLE 1 MG PO TABS: 1.0000 mg | ORAL_TABLET | Freq: Every day | ORAL | Status: DC

## 2013-10-13 NOTE — Progress Notes (Signed)
Holly Woodard  [phone] [phone]  OFFICE PROGRESS NOTE   ID: Holly Woodard   DOB: 13-Aug-1950  MR#: [id number]  MVE#:[id number]   PCP: Holly Solo, MD SU: Holly Lasso, MD OTHER MD: Holly Promise, MD  CC: Patient for follow up on Breast cancer.  HISTORY OF PRESENT ILLNESS: From Dr. Collier Salina Woodard's new patient evaluation note dated 05/16/2009: "Holly Woodard is here today with a friend of hers for evaluation and discussion of breast cancer.  This is a delightful 63 year old woman from [facility] referred by Dr. Margot Woodard for evaluation and treatment of breast cancer.  This woman has been in good health.  She has really no chronic medical problems.  She takes glucosamine on a chronic basis and that is about it.  She does undergo annual screening mammography.  She had a mammogram on 03/08/2009.  This showed asymmetry in the left breast.  Additional views were recommended.  On 03/15/2009, she had a diagnostic mammogram and ultrasound.  The left breast ultrasound showed a hypoechoic mass measuring 1.6 x 1.2 x 1.4 cm perpendicular to the chest wall.  No other abnormalities were seen.  A biopsy was recommended.  A biopsy took place.  Subsequently, a BSGI scan on 04/03/2009 was also performed, which showed a solitary area of abnormality.  MRI scan of both breasts 04/08/2009 showed a spiculated mass in the lateral aspect of the left breast measuring 1.8 x 1.1 x 0.7 cm.  The biopsy of that was performed, which showed this to be an invasive ductal cancer, ER and PR positive at 99% and 51% respectively, HER-2 was not over amplified, Ki-67 20%.  The patient underwent a lumpectomy and sentinel lymph node evaluation 04/25/2009.  Final pathology 1.7 cm grade 2 of 3 invasive ductal cancer.  All resection margins clear.  Four lymph nodes all negative for malignancy.  The patient has had an unremarkable postoperative course." Oncotype score of 27 predicted an average rate  of distant recurrence at 10 years of 18% if the patient's only systemic therapy was tamoxifen for 5 years.She is status post adjuvant chemotherapy with docetaxel and cyclophosphamide x4 given between 06/01/2009 and 08/03/2009 . She is status post adjuvant radiation to the left breast from 03/31/2009 through 10/24/2009.  She started antiestrogen therapy with anastrozole in August of 2011; bone density 07/20/2012 was normal.   INTERVAL HISTORY: Patient denies any patient have a sister diagnosed with breast cancer she was tested for BRCA gene and was negative sisters 55 years old patient is tolerating Arimidex well she has to continue it through August of 2016 she denies hot flashes arthralgias but does have some mild arthritis symptoms she denies any new lumps or masses in the breast. She continues to do self breast exam on a monthly basis. We've refilled her Arimidex today. She'll a small area of eczema in her head. She's doing some physical activity.   REVIEW OF SYSTEMS: She is tolerating the anastrozole with no significant side effects and in particular no hot flashes, vaginal dryness or arthralgias/myalgias. She has some urinary stress incontinence which is not a new problem. No headaches eye problems nausea vomiting diarrhea abdominal pain. No shortness of breath cough pleurisy. No hot flashes. She is doing some exercises. No new onset of joint pains. Overall she's doing well and review of systems negative.  PAST MEDICAL HISTORY: Past Medical History  Diagnosis Date  . Breast cancer   . Arthritis     Degenerative disk  PAST SURGICAL HISTORY: Past Surgical History  Procedure Laterality Date  . Breast lumpectomy Left 04/25/2009     FAMILY HISTORY Family History  Problem Relation Age of Onset  . Diabetes Mother   . Dementia Mother   . Heart attack Mother   . Cancer Father     Lung CA  . Hypertension Father   . Hyperlipidemia Father   . Cancer Sister 14    DCIS  Father is alive  and well, age 63.  Mother is deceased with complications of pneumonia and diabetes.  One sister and one brother both live in [place].  Sister diagnosed in 2003 with DCIS status post lumpectomy and radiation and no adjuvant hormonal therapy.  Brother is in good health.  Sister diagnosed recently with breast cancer and BRCA test was negative. She is getting workup at [facility].   GYNECOLOGIC HISTORY: G1, P1, menarche age 19, menopausal from few years, no history of hormone replacement therapy.   SOCIAL HISTORY: The patient was married for 14 years, divorced for over 10 years.  She has an adult son, living in [place].  The patient works as a Neurosurgeon (Education officer, museum) for [employer]: History  Substance Use Topics  . Smoking status: Never Smoker   . Smokeless tobacco: Never Used  . Alcohol Use: Yes     Comment: Rarely/Social    Colonoscopy: Not on file PAP: 12/19/2009 Bone density: April 2014/normal Lipid panel: Not on file  No Known Allergies  Current Outpatient Prescriptions  Medication Sig Dispense Refill  . cholecalciferol (VITAMIN D) 1000 UNITS tablet Take 2,000 Units by mouth every other day.      . anastrozole (ARIMIDEX) 1 MG tablet Take 1 tablet (1 mg total) by mouth daily.  90 tablet  3   No current facility-administered medications for this visit.    OBJECTIVE: Middle-aged white woman who appears well Filed Vitals:   10/13/13 1114  BP: 143/69  Pulse: 62  Temp: 98.4 F (36.9 C)  Resp: 20     Body mass index is 35.2 kg/(m^2).      ECOG PS: 0          Sclerae unicteric Oropharynx clear No cervical or supraclavicular adenopathy Lungs no rales or rhonchi Heart regular rate and rhythm Abd soft, obese, nontender, positive bowel sounds MSK no focal spinal tenderness, no peripheral edema Neuro: nonfocal, well oriented, positive affect Breasts: The right breast is unremarkable; the left breast is  status post lumpectomy. The incision is healing very nicely. The cosmetic result is good. There is no evidence of local recurrence. The left axilla is benign.   LAB RESULTS: Lab Results  Component Value Date   WBC 5.9 10/13/2013   NEUTROABS 4.0 10/13/2013   HGB 14.2 10/13/2013   HCT 42.7 10/13/2013   MCV 91.7 10/13/2013   PLT 240 10/13/2013      Chemistry      Component Value Date/Time   NA 140 10/13/2013 1047   NA 138 10/28/2010 1053   K 4.2 10/13/2013 1047   K 4.2 10/28/2010 1053   CL 106 10/28/2010 1053   CO2 23 10/13/2013 1047   CO2 20 10/28/2010 1053   BUN 12.5 10/13/2013 1047   BUN 16 10/28/2010 1053   CREATININE 0.7 10/13/2013 1047   CREATININE 0.63 10/28/2010 1053      Component Value Date/Time   CALCIUM 9.6 10/13/2013 1047   CALCIUM 9.2 10/28/2010 1053   ALKPHOS 87  10/13/2013 1047   ALKPHOS 68 10/28/2010 1053   AST 22 10/13/2013 1047   AST 21 10/28/2010 1053   ALT 24 10/13/2013 1047   ALT 16 10/28/2010 1053   BILITOT 0.66 10/13/2013 1047   BILITOT 0.5 10/28/2010 1053       STUDIES: Bone density July 20 2012 was normal and showed improvement of all 3 sites L spine 12%, left femur 5.9% and right femur 9% cf prior study of 2011 (both done at [facility])   Mammogram done at [facility] on 04/19/2013 showed stable post-treatment changes and BIRADS Category 2 mammogram  ASSESSMENT: 63 y.o. [place] woman:   1.  Status post left breast lumpectomy with sentinel node biopsy 04/25/2009 for a  pT1c, pN0, stage IA invasive ductal carcinoma, grade 2, ER 99%, PR 51%, Ki-67 20%, HER-2/neu negative  2. Oncotype score of 27 predicted an average rate of distant recurrence at 10 years of 18% if the patient's only systemic therapy was tamoxifen for 5 years.  3.  Status post adjuvant chemotherapy with docetaxel and cyclophosphamide x4 given between 06/01/2009 and 08/03/2009 .  4.  Status post adjuvant radiation to the left breast from 03/31/2009 through 10/24/2009.  6.  started antiestrogen therapy with  anastrozole in August of 2011; bone density 07/20/2012 was normal.  PLAN:  1. She is tolerating the anastrozole well, and with a normal bone density we are not going to switch to tamoxifen. The standard of care is still 5 years of her aromatase inhibitor, which means she will complete her treatments August of 2016.   2. today her physical examination, breast exam and blood work are all fine. We reviewed the mammogram from this year 2015 which was normal  3. Continue exercises and diet modification.  4. RTC in 1 year and we will order her labs and Mammogram for next year.  Bernadene Bell, MD Medical Hematologist/Oncologist [place] Pager: [phone] Office No: [phone]  10/13/2013, 1:49 PM

## 2013-10-17 ENCOUNTER — Telehealth: Payer: Self-pay | Admitting: Hematology

## 2013-10-17 NOTE — Telephone Encounter (Signed)
s.w. pt and advised on appt...ok....mailed pt appt sched/avs/letter for pt for Jan and July 2016

## 2014-07-24 ENCOUNTER — Encounter: Payer: Self-pay | Admitting: Oncology

## 2014-10-05 ENCOUNTER — Other Ambulatory Visit: Payer: BC Managed Care – PPO

## 2014-10-10 ENCOUNTER — Encounter: Payer: Self-pay | Admitting: Hematology and Oncology

## 2014-10-10 NOTE — Progress Notes (Signed)
Terri faxed forms for patient  [employer].Marland Kitchen  [phone]. Copy in file

## 2014-10-12 ENCOUNTER — Ambulatory Visit: Payer: BC Managed Care – PPO | Admitting: Hematology and Oncology

## 2014-10-13 ENCOUNTER — Telehealth: Payer: Self-pay

## 2014-10-13 NOTE — Telephone Encounter (Signed)
Medical Release form for Research study Moving [employer] together faxed to [facility].  Sent to scan.

## 2014-10-24 ENCOUNTER — Other Ambulatory Visit: Payer: Self-pay | Admitting: Hematology

## 2014-10-25 ENCOUNTER — Other Ambulatory Visit: Payer: Self-pay

## 2014-10-25 DIAGNOSIS — C50412 Malignant neoplasm of upper-outer quadrant of left female breast: Secondary | ICD-10-CM

## 2014-10-26 ENCOUNTER — Other Ambulatory Visit (HOSPITAL_BASED_OUTPATIENT_CLINIC_OR_DEPARTMENT_OTHER): Payer: BLUE CROSS/BLUE SHIELD

## 2014-10-26 DIAGNOSIS — Z853 Personal history of malignant neoplasm of breast: Secondary | ICD-10-CM | POA: Diagnosis not present

## 2014-10-26 DIAGNOSIS — C50412 Malignant neoplasm of upper-outer quadrant of left female breast: Secondary | ICD-10-CM

## 2014-10-26 LAB — COMPREHENSIVE METABOLIC PANEL (CC13)
ALBUMIN: 3.9 g/dL (ref 3.5–5.0)
ALK PHOS: 88 U/L (ref 40–150)
ALT: 28 U/L (ref 0–55)
AST: 24 U/L (ref 5–34)
Anion Gap: 8 mEq/L (ref 3–11)
BUN: 13.6 mg/dL (ref 7.0–26.0)
CALCIUM: 9.2 mg/dL (ref 8.4–10.4)
CHLORIDE: 106 meq/L (ref 98–109)
CO2: 23 mEq/L (ref 22–29)
Creatinine: 0.7 mg/dL (ref 0.6–1.1)
EGFR: >90 mL/min/{1.73_m2} (ref >90)
GLUCOSE: 103 mg/dL (ref 70–140)
Potassium: 4.1 mEq/L (ref 3.5–5.1)
SODIUM: 136 meq/L (ref 136–145)
TOTAL PROTEIN: 6.6 g/dL (ref 6.4–8.3)
Total Bilirubin: 0.55 mg/dL (ref 0.20–1.20)

## 2014-10-26 LAB — CBC WITH DIFFERENTIAL/PLATELET
BASO%: 0.9 % (ref 0.0–2.0)
BASOS ABS: 0.1 10*3/uL (ref 0.0–0.1)
EOS%: 1.8 % (ref 0.0–7.0)
Eosinophils Absolute: 0.1 10*3/uL (ref 0.0–0.5)
HCT: 42.3 % (ref 34.8–46.6)
HGB: 14.2 g/dL (ref 11.6–15.9)
LYMPH%: 20.4 % (ref 14.0–49.7)
MCH: 30.5 pg (ref 25.1–34.0)
MCHC: 33.5 g/dL (ref 31.5–36.0)
MCV: 91 fL (ref 79.5–101.0)
MONO#: 0.5 10*3/uL (ref 0.1–0.9)
MONO%: 6.6 % (ref 0.0–14.0)
NEUT%: 70.3 % (ref 38.4–76.8)
NEUTROS ABS: 5.5 10*3/uL (ref 1.5–6.5)
Platelets: 253 10*3/uL (ref 145–400)
RBC: 4.65 10*6/uL (ref 3.70–5.45)
RDW: 13 % (ref 11.2–14.5)
WBC: 7.9 10*3/uL (ref 3.9–10.3)
lymph#: 1.6 10*3/uL (ref 0.9–3.3)

## 2014-10-27 MED ORDER — HYDROMORPHONE HCL 4 MG/ML IJ SOLN
INTRAMUSCULAR | Status: AC
  Filled 2014-10-27: qty 1

## 2014-11-02 ENCOUNTER — Ambulatory Visit (HOSPITAL_BASED_OUTPATIENT_CLINIC_OR_DEPARTMENT_OTHER): Payer: BLUE CROSS/BLUE SHIELD | Admitting: Hematology and Oncology

## 2014-11-02 ENCOUNTER — Encounter: Payer: Self-pay | Admitting: Hematology and Oncology

## 2014-11-02 ENCOUNTER — Telehealth: Payer: Self-pay | Admitting: Hematology and Oncology

## 2014-11-02 VITALS — BP 123/59 | HR 72 | Temp 98.3°F | Resp 18 | Ht 65.0 in | Wt 215.6 lb

## 2014-11-02 DIAGNOSIS — Z79811 Long term (current) use of aromatase inhibitors: Secondary | ICD-10-CM

## 2014-11-02 DIAGNOSIS — C50919 Malignant neoplasm of unspecified site of unspecified female breast: Secondary | ICD-10-CM

## 2014-11-02 DIAGNOSIS — C50912 Malignant neoplasm of unspecified site of left female breast: Secondary | ICD-10-CM

## 2014-11-02 DIAGNOSIS — C50412 Malignant neoplasm of upper-outer quadrant of left female breast: Secondary | ICD-10-CM

## 2014-11-02 MED ORDER — ANASTROZOLE 1 MG PO TABS: 1.0000 mg | ORAL_TABLET | Freq: Every day | ORAL | Status: DC

## 2014-11-02 NOTE — Assessment & Plan Note (Signed)
Left breast invasive ductal carcinoma 1.7 cm status post left lumpectomy 04/25/2009 T1 cN0 M0 stage IA, grade 2, ER 99%, PR 51%, 6-720%, HER-2 negative, Oncotype DX recurrence score 27, 18% risk of recurrence, status post adjuvant chemotherapy with Taxotere Cytoxan 4 completed 08/03/2009, status post radiation completed 10/24/2009, started anastrozole 1 mg daily August 2011 completed 5 years of August 2016  Anastrozole toxicities: Bone density 07/18/2012: Score -0.8 normal  Breast Cancer Surveillance: 1. Breast exam 11/02/2014: Normal 2. Mammogram 04/20/2014 No abnormalities. Postsurgical changes. Breast Density Category A. I recommended that she get 3-D mammograms for surveillance. Discussed the differences between different breast density categories.   Return to clinic in 1 year for surveillance and follow

## 2014-11-02 NOTE — Telephone Encounter (Signed)
appointments made and avs printed for patient °

## 2014-11-02 NOTE — Progress Notes (Signed)
Patient Care Team: Kathyrn Lass, MD as PCP - General (Family Medicine)  DIAGNOSIS: Breast cancer of upper-outer quadrant of left female breast   Staging form: Breast, AJCC 7th Edition     Clinical: Stage IA (T1c, N0, cM0) - Signed by Chauncey Cruel, MD on 01/06/2013   SUMMARY OF ONCOLOGIC HISTORY:   Breast cancer of upper-outer quadrant of left female breast   03/08/2009 Initial Diagnosis left breast 1.6 cm mass, MRI revealed 1.8 cm mass, biopsy showed IDC ER 99%, PR 51%, HER-2 negative, Ki-67 20%   04/25/2009 Surgery 1.7 cm grade 2 of 3 invasive ductal cancer. 0/4 LN Neg, Oncotype DX score 27, 18% risk of recurrence, T1c N0 stage IA   06/01/2009 - 08/03/2009 Chemotherapy adjuvant chemotherapy with Taxotere and Cytoxan 4   07/09/2009 - 10/24/2009 Radiation Therapy adjuvant radiation therapy   11/01/2009 -  Anti-estrogen oral therapy anastrozole 1 mg daily    CHIEF COMPLIANT: Follow-up on anastrozole  INTERVAL HISTORY: Holly Woodard is a 64 year old above-mentioned history of left breast cancer treated with lumpectomy radiation and adjuvant antiestrogen therapy. She completed 5 years of anastrozole and was wondering if he should continue beyond 5 years. Denies any problems taking anastrozole. Denies hot flashes or myalgias. She does have some hair loss which she attributes to menopausal symptoms rather than anastrozole.  REVIEW OF SYSTEMS:   Constitutional: Denies fevers, chills or abnormal weight loss Eyes: Denies blurriness of vision Ears, nose, mouth, throat, and face: Denies mucositis or sore throat Respiratory: Denies cough, dyspnea or wheezes Cardiovascular: Denies palpitation, chest discomfort or lower extremity swelling Gastrointestinal:  Denies nausea, heartburn or change in bowel habits Skin: Denies abnormal skin rashes Lymphatics: Denies new lymphadenopathy or easy bruising Neurological:Denies numbness, tingling or new weaknesses Behavioral/Psych: Mood is stable, no new changes   Breast:  denies any pain or lumps or nodules in either breasts All other systems were reviewed with the patient and are negative.  I have reviewed the past medical history, past surgical history, social history and family history with the patient and they are unchanged from previous note.  ALLERGIES:  has No Known Allergies.  MEDICATIONS:  Current Outpatient Prescriptions  Medication Sig Dispense Refill  . anastrozole (ARIMIDEX) 1 MG tablet Take 1 tablet (1 mg total) by mouth daily. 90 tablet 3  . cholecalciferol (VITAMIN D) 1000 UNITS tablet Take 2,000 Units by mouth every other day.     No current facility-administered medications for this visit.    PHYSICAL EXAMINATION: ECOG PERFORMANCE STATUS: 0 - Asymptomatic  There were no vitals filed for this visit. There were no vitals filed for this visit.  GENERAL:alert, no distress and comfortable SKIN: skin color, texture, turgor are normal, no rashes or significant lesions EYES: normal, Conjunctiva are pink and non-injected, sclera clear OROPHARYNX:no exudate, no erythema and lips, buccal mucosa, and tongue normal  NECK: supple, thyroid normal size, non-tender, without nodularity LYMPH:  no palpable lymphadenopathy in the cervical, axillary or inguinal LUNGS: clear to auscultation and percussion with normal breathing effort HEART: regular rate & rhythm and no murmurs and no lower extremity edema ABDOMEN:abdomen soft, non-tender and normal bowel sounds Musculoskeletal:no cyanosis of digits and no clubbing  NEURO: alert & oriented x 3 with fluent speech, no focal motor/sensory deficits BREAST: No palpable masses or nodules in either right or left breasts. No palpable axillary supraclavicular or infraclavicular adenopathy no breast tenderness or nipple discharge. (exam performed in the presence of a chaperone)  LABORATORY DATA:  I have reviewed  the data as listed   Chemistry      Component Value Date/Time   NA 136 10/26/2014 1551    NA 138 10/28/2010 1053   K 4.1 10/26/2014 1551   K 4.2 10/28/2010 1053   CL 106 10/28/2010 1053   CO2 23 10/26/2014 1551   CO2 20 10/28/2010 1053   BUN 13.6 10/26/2014 1551   BUN 16 10/28/2010 1053   CREATININE 0.7 10/26/2014 1551   CREATININE 0.63 10/28/2010 1053      Component Value Date/Time   CALCIUM 9.2 10/26/2014 1551   CALCIUM 9.2 10/28/2010 1053   ALKPHOS 88 10/26/2014 1551   ALKPHOS 68 10/28/2010 1053   AST 24 10/26/2014 1551   AST 21 10/28/2010 1053   ALT 28 10/26/2014 1551   ALT 16 10/28/2010 1053   BILITOT 0.55 10/26/2014 1551   BILITOT 0.5 10/28/2010 1053       Lab Results  Component Value Date   WBC 7.9 10/26/2014   HGB 14.2 10/26/2014   HCT 42.3 10/26/2014   MCV 91.0 10/26/2014   PLT 253 10/26/2014   NEUTROABS 5.5 10/26/2014     RADIOGRAPHIC STUDIES: I have personally reviewed the radiology reports and agreed with their findings. Mammogram January 2016 normal breast density category A  ASSESSMENT & PLAN:  Breast cancer of upper-outer quadrant of left female breast Left breast invasive ductal carcinoma 1.7 cm status post left lumpectomy 04/25/2009 T1 cN0 M0 stage IA, grade 2, ER 99%, PR 51%, 6-720%, HER-2 negative, Oncotype DX recurrence score 27, 18% risk of recurrence, status post adjuvant chemotherapy with Taxotere Cytoxan 4 completed 08/03/2009, status post radiation completed 10/24/2009, started anastrozole 1 mg daily August 2011 completed 5 years of August 2016 but we elected to discontinue treatment beyond 5 years.  Anastrozole toxicities: Bone density 07/18/2012: Score -0.8 normal I had lengthy discussions regarding continuation of anastrozole with the patient. We would like to do breast cancer index however because of her low cross [facility] which does not cover for breast cancer index test we're unable to order it at this time. We will however find out more about this issue and follow-up with her on it.  Breast Cancer  Surveillance: 1. Breast exam 11/02/2014: Normal 2. Mammogram 04/20/2014 No abnormalities. Postsurgical changes. Breast Density Category A. I recommended that she get 3-D mammograms for surveillance. Discussed the differences between different breast density categories.   Return to clinic in 1 year for surveillance and follow    No orders of the defined types were placed in this encounter.   The patient has a good understanding of the overall plan. she agrees with it. she will call with any problems that may develop before the next visit here.   Rulon Eisenmenger, MD

## 2015-04-23 ENCOUNTER — Telehealth: Payer: Self-pay | Admitting: *Deleted

## 2015-04-23 NOTE — Telephone Encounter (Signed)
Patient left message that she is experiencing some soreness to left breast and has scheduled herself for a mammogram this Thursday at [facility]. Left VMM for patient  That we will wait for the results of the mammogram. Advised to call clinic for further questions or concerns.

## 2015-04-23 NOTE — Telephone Encounter (Signed)
error 

## 2015-11-04 ENCOUNTER — Other Ambulatory Visit: Payer: Self-pay | Admitting: Hematology and Oncology

## 2015-11-04 DIAGNOSIS — C50912 Malignant neoplasm of unspecified site of left female breast: Secondary | ICD-10-CM

## 2015-11-04 DIAGNOSIS — C50919 Malignant neoplasm of unspecified site of unspecified female breast: Secondary | ICD-10-CM

## 2015-11-06 ENCOUNTER — Ambulatory Visit (HOSPITAL_BASED_OUTPATIENT_CLINIC_OR_DEPARTMENT_OTHER): Payer: PRIVATE HEALTH INSURANCE | Admitting: Hematology and Oncology

## 2015-11-06 ENCOUNTER — Telehealth: Payer: Self-pay | Admitting: Hematology and Oncology

## 2015-11-06 ENCOUNTER — Encounter: Payer: Self-pay | Admitting: Hematology and Oncology

## 2015-11-06 DIAGNOSIS — Z79811 Long term (current) use of aromatase inhibitors: Secondary | ICD-10-CM

## 2015-11-06 DIAGNOSIS — C50412 Malignant neoplasm of upper-outer quadrant of left female breast: Secondary | ICD-10-CM

## 2015-11-06 NOTE — Assessment & Plan Note (Addendum)
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 Breast exam 11/06/2015 Normal 2. Mammogram 04/20/2014 No abnormalities. Postsurgical changes. Breast Density Category A. I recommended that she get 3-D mammograms for surveillance. Discussed the differences between different breast density categories.   Return to survivorship clinic in 1 year for surveillance and follow up.

## 2015-11-06 NOTE — Progress Notes (Signed)
Patient Care Team: Kathyrn Lass, MD as PCP - General (Family Medicine)  DIAGNOSIS: Breast cancer of upper-outer quadrant of left female breast [facility])   Staging form: Breast, AJCC 7th Edition   - Clinical: Stage IA (T1c, N0, cM0) - Signed by Chauncey Cruel, MD on 01/06/2013  SUMMARY OF ONCOLOGIC HISTORY:   Breast cancer of upper-outer quadrant of left female breast ([place])   03/08/2009 Initial Diagnosis    left breast 1.6 cm mass, MRI revealed 1.8 cm mass, biopsy showed IDC ER 99%, PR 51%, HER-2 negative, Ki-67 20%     04/25/2009 Surgery    1.7 cm grade 2 of 3 invasive ductal cancer. 0/4 LN Neg, Oncotype DX score 27, 18% risk of recurrence, T1c N0 stage IA     06/01/2009 - 08/03/2009 Chemotherapy    adjuvant chemotherapy with Taxotere and Cytoxan 4     07/09/2009 - 10/24/2009 Radiation Therapy    adjuvant radiation therapy     11/01/2009 -  Anti-estrogen oral therapy    anastrozole 1 mg daily      CHIEF COMPLIANT: Follow-up on anastrozole therapy  INTERVAL HISTORY: Holly Woodard is a 65 year old with above-mentioned history of left breast cancer treated with lumpectomy and radiation and currently on adjuvant antiestrogen therapy with anastrozole. She's been on it for the past 6 years. She reports no major problems tolerating it. She denies any muscle aches or pains denies hot flashes. She denies lumps or nodules in the breasts. Her mammogram done at [facility] was benign. She also had to undergo ultrasound for further evaluation.  REVIEW OF SYSTEMS:   Constitutional: Denies fevers, chills or abnormal weight loss Eyes: Denies blurriness of vision Ears, nose, mouth, throat, and face: Denies mucositis or sore throat Respiratory: Denies cough, dyspnea or wheezes Cardiovascular: Denies palpitation, chest discomfort Gastrointestinal:  Denies nausea, heartburn or change in bowel habits Skin: Denies abnormal skin rashes Lymphatics: Denies new lymphadenopathy or easy  bruising Neurological:Denies numbness, tingling or new weaknesses Behavioral/Psych: Mood is stable, no new changes  Extremities: No lower extremity edema Breast:  denies any pain or lumps or nodules in either breasts All other systems were reviewed with the patient and are negative.  I have reviewed the past medical history, past surgical history, social history and family history with the patient and they are unchanged from previous note.  ALLERGIES:  has No Known Allergies.  MEDICATIONS:  Current Outpatient Prescriptions  Medication Sig Dispense Refill  . anastrozole (ARIMIDEX) 1 MG tablet TAKE 1 TABLET (1 MG TOTAL) BY MOUTH DAILY. 90 tablet 0  . cholecalciferol (VITAMIN D) 1000 UNITS tablet Take 2,000 Units by mouth every other day.     No current facility-administered medications for this visit.     PHYSICAL EXAMINATION: ECOG PERFORMANCE STATUS: 1 - Symptomatic but completely ambulatory  Vitals:   11/06/15 1535  BP: (!) 145/87  Pulse: 65  Resp: 18  Temp: 98.9 F (37.2 C)   Filed Weights   11/06/15 1535  Weight: 214 lb 14.4 oz (97.5 kg)    GENERAL:alert, no distress and comfortable SKIN: skin color, texture, turgor are normal, no rashes or significant lesions EYES: normal, Conjunctiva are pink and non-injected, sclera clear OROPHARYNX:no exudate, no erythema and lips, buccal mucosa, and tongue normal  NECK: supple, thyroid normal size, non-tender, without nodularity LYMPH:  no palpable lymphadenopathy in the cervical, axillary or inguinal LUNGS: clear to auscultation and percussion with normal breathing effort HEART: regular rate & rhythm and no murmurs and no lower  extremity edema ABDOMEN:abdomen soft, non-tender and normal bowel sounds MUSCULOSKELETAL:no cyanosis of digits and no clubbing  NEURO: alert & oriented x 3 with fluent speech, no focal motor/sensory deficits EXTREMITIES: No lower extremity edema BREAST: No palpable masses or nodules in either right or  left breasts. No palpable axillary supraclavicular or infraclavicular adenopathy no breast tenderness or nipple discharge. (exam performed in the presence of a chaperone)  LABORATORY DATA:  I have reviewed the data as listed   Chemistry      Component Value Date/Time   NA 136 10/26/2014 1551   K 4.1 10/26/2014 1551   CL 106 10/28/2010 1053   CO2 23 10/26/2014 1551   BUN 13.6 10/26/2014 1551   CREATININE 0.7 10/26/2014 1551      Component Value Date/Time   CALCIUM 9.2 10/26/2014 1551   ALKPHOS 88 10/26/2014 1551   AST 24 10/26/2014 1551   ALT 28 10/26/2014 1551   BILITOT 0.55 10/26/2014 1551       Lab Results  Component Value Date   WBC 7.9 10/26/2014   HGB 14.2 10/26/2014   HCT 42.3 10/26/2014   MCV 91.0 10/26/2014   PLT 253 10/26/2014   NEUTROABS 5.5 10/26/2014     ASSESSMENT & PLAN:  Breast cancer of upper-outer quadrant of left female breast Left breast invasive ductal carcinoma 1.7 cm status post left lumpectomy 04/25/2009 T1 cN0 M0 stage IA, grade 2, ER 99%, PR 51%, 6-720%, HER-2 negative, Oncotype DX recurrence score 27, 18% risk of recurrence, status post adjuvant chemotherapy with Taxotere Cytoxan 4 completed 08/03/2009, status post radiation completed 10/24/2009, started anastrozole 1 mg daily August 2011 completed 5 years of August 2016  Anastrozole toxicities: Denies any side effects anastrozole therapy. I recommended that we obtain breast cancer index for further evaluation. She may still make a decision to continue with antiestrogen therapy even if she was low risk. This is because her sister is going through metastatic breast cancer treatment.  Breast Cancer Surveillance: 1. Breast exam 11/06/2015 Normal 2. Mammogram 04/20/2014 No abnormalities. Postsurgical changes. Breast Density Category A. I recommended that she get 3-D mammograms for surveillance. Discussed the differences between different breast density categories.   Return to survivorship clinic  in 1 year for surveillance and follow up.   No orders of the defined types were placed in this encounter.  The patient has a good understanding of the overall plan. she agrees with it. she will call with any problems that may develop before the next visit here.   Rulon Eisenmenger, MD 11/06/15

## 2015-11-06 NOTE — Telephone Encounter (Signed)
appt made and avs printed °

## 2015-12-28 ENCOUNTER — Telehealth: Payer: Self-pay | Admitting: *Deleted

## 2015-12-28 NOTE — Telephone Encounter (Signed)
"  I saw Dr. Lindi Adie in August.  Awaiting Breast cancer Index result call to see if I should continue arimidex.  Please call my mobile number [phone]."

## 2016-01-01 ENCOUNTER — Encounter: Payer: Self-pay | Admitting: *Deleted

## 2016-01-01 ENCOUNTER — Telehealth: Payer: Self-pay | Admitting: *Deleted

## 2016-01-01 NOTE — Telephone Encounter (Signed)
Ordered BCI per Dr. Lindi Adie. Faxed requisition to [facility].

## 2016-01-02 NOTE — Telephone Encounter (Signed)
Upon chart review, it was found that BCI was never ordered and thus we have no results to report.  Informed Keisha, RN Navigator who stated she would order BCI and we should have results within next 3 weeks.  I called pt to discuss and apologize for this; however, I was unable to reach pt and thus left VM.  Informed pt that this was ordered 10/3 and we would follow up with her once results were received.  Left contact information should pt wish to call back to discuss further.

## 2016-01-03 NOTE — Telephone Encounter (Signed)
"  We need the social security number to verify eligibility for the BCI.  Med cost doesn't do this."  Social Security number provided.

## 2016-01-22 ENCOUNTER — Telehealth: Payer: Self-pay | Admitting: Hematology and Oncology

## 2016-01-22 ENCOUNTER — Encounter (HOSPITAL_COMMUNITY): Payer: Self-pay

## 2016-01-22 NOTE — Telephone Encounter (Signed)
I discussed with the patient the result of breast cancer index. It came back as high risk of distant recurrence between your 5 the year 10. It also showed high likelihood of benefit from extended adjuvant endocrine therapy. Recommended that she remain on the anastrozole for full 10 years. I will mail a copy of this report for her records.

## 2016-02-11 ENCOUNTER — Other Ambulatory Visit: Payer: Self-pay | Admitting: Hematology and Oncology

## 2016-02-11 DIAGNOSIS — C50912 Malignant neoplasm of unspecified site of left female breast: Secondary | ICD-10-CM

## 2016-02-11 DIAGNOSIS — C50919 Malignant neoplasm of unspecified site of unspecified female breast: Secondary | ICD-10-CM

## 2016-04-08 ENCOUNTER — Other Ambulatory Visit: Payer: Self-pay | Admitting: Family Medicine

## 2016-04-08 ENCOUNTER — Other Ambulatory Visit (HOSPITAL_COMMUNITY)
Admission: RE | Admit: 2016-04-08 | Discharge: 2016-04-08 | Disposition: A | Discharge status: Home / Self Care | Payer: PRIVATE HEALTH INSURANCE | Source: Ambulatory Visit | Attending: Family Medicine | Admitting: Family Medicine

## 2016-04-08 DIAGNOSIS — Z1151 Encounter for screening for human papillomavirus (HPV): Secondary | ICD-10-CM | POA: Insufficient documentation

## 2016-04-08 DIAGNOSIS — Z124 Encounter for screening for malignant neoplasm of cervix: Secondary | ICD-10-CM | POA: Diagnosis present

## 2016-04-10 LAB — CYTOLOGY - PAP
DIAGNOSIS: NEGATIVE
HPV (WINDOPATH): NOT DETECTED

## 2016-05-01 NOTE — Progress Notes (Signed)
Received [facility] 3d mammogram

## 2016-05-13 ENCOUNTER — Other Ambulatory Visit: Payer: Self-pay | Admitting: Emergency Medicine

## 2016-05-13 DIAGNOSIS — C50912 Malignant neoplasm of unspecified site of left female breast: Secondary | ICD-10-CM

## 2016-05-13 DIAGNOSIS — C50919 Malignant neoplasm of unspecified site of unspecified female breast: Secondary | ICD-10-CM

## 2016-05-13 MED ORDER — ANASTROZOLE 1 MG PO TABS: 1.0000 mg | ORAL_TABLET | Freq: Every day | ORAL | 3 refills | Status: DC

## 2016-11-04 NOTE — Assessment & Plan Note (Signed)
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 Breast exam 11/05/2016 Normal 2. Mammogram 04/21/2015 at [facility] No abnormalities. Postsurgical changes. Breast Density Category A.   Return to clinic in 1 year for surveillance and follow up.

## 2016-11-05 ENCOUNTER — Ambulatory Visit (HOSPITAL_BASED_OUTPATIENT_CLINIC_OR_DEPARTMENT_OTHER): Payer: PRIVATE HEALTH INSURANCE | Admitting: Hematology and Oncology

## 2016-11-05 DIAGNOSIS — Z170 Estrogen receptor positive status [ER+]: Secondary | ICD-10-CM

## 2016-11-05 DIAGNOSIS — C50412 Malignant neoplasm of upper-outer quadrant of left female breast: Secondary | ICD-10-CM | POA: Diagnosis not present

## 2016-11-05 DIAGNOSIS — Z79811 Long term (current) use of aromatase inhibitors: Secondary | ICD-10-CM

## 2016-11-05 NOTE — Progress Notes (Signed)
Patient Care Team: Kathyrn Lass, MD as PCP - General (Family Medicine)  DIAGNOSIS:  Encounter Diagnosis  Name Primary?  . Malignant neoplasm of upper-outer quadrant of left breast in female, estrogen receptor positive ([place])     SUMMARY OF ONCOLOGIC HISTORY:   Breast cancer of upper-outer quadrant of left female breast ([place])   03/08/2009 Initial Diagnosis    left breast 1.6 cm mass, MRI revealed 1.8 cm mass, biopsy showed IDC ER 99%, PR 51%, HER-2 negative, Ki-67 20%      04/25/2009 Surgery    1.7 cm grade 2 of 3 invasive ductal cancer. 0/4 LN Neg, Oncotype DX score 27, 18% risk of recurrence, T1c N0 stage IA      06/01/2009 - 08/03/2009 Chemotherapy    adjuvant chemotherapy with Taxotere and Cytoxan 4      07/09/2009 - 10/24/2009 Radiation Therapy    adjuvant radiation therapy      11/01/2009 - 11/05/2016 Anti-estrogen oral therapy    anastrozole 1 mg daily ( breast cancer index showed high risk of recurrence and high likelihood of benefit from extended adjuvant therapy)       CHIEF COMPLIANT: Follow-up on anastrozole therapy  INTERVAL HISTORY: Holly Woodard is a 66 year old with above-mentioned history of left breast cancer treated with adjuvant chemotherapy and radiation is currently on anastrozole. She is tolerating anastrozole extremely well. She has completed 7 years of therapy. She had breast cancer index which revealed that she had high risk of recurrence and hence she has been on extended adjuvant therapy. She denies any hot flashes or myalgias. She denies any lumps or nodules in the breasts.  REVIEW OF SYSTEMS:   Constitutional: Denies fevers, chills or abnormal weight loss Eyes: Denies blurriness of vision Ears, nose, mouth, throat, and face: Denies mucositis or sore throat Respiratory: Denies cough, dyspnea or wheezes Cardiovascular: Denies palpitation, chest discomfort Gastrointestinal:  Denies nausea, heartburn or change in bowel habits Skin: Denies abnormal  skin rashes Lymphatics: Denies new lymphadenopathy or easy bruising Neurological:Denies numbness, tingling or new weaknesses Behavioral/Psych: Mood is stable, no new changes  Extremities: No lower extremity edema Breast:  denies any pain or lumps or nodules in either breasts All other systems were reviewed with the patient and are negative.  I have reviewed the past medical history, past surgical history, social history and family history with the patient and they are unchanged from previous note.  ALLERGIES:  has No Known Allergies.  MEDICATIONS:  Current Outpatient Prescriptions  Medication Sig Dispense Refill  . anastrozole (ARIMIDEX) 1 MG tablet Take 1 tablet (1 mg total) by mouth daily. 90 tablet 3  . cholecalciferol (VITAMIN D) 1000 UNITS tablet Take 2,000 Units by mouth every other day.     No current facility-administered medications for this visit.     PHYSICAL EXAMINATION: ECOG PERFORMANCE STATUS: 1 - Symptomatic but completely ambulatory  Vitals:   11/06/16 0800  BP: (!) 159/76  Pulse: 66  Resp: 20  Temp: 98.1 F (36.7 C)  SpO2: 99%   Filed Weights   11/06/16 0800  Weight: 216 lb 1.6 oz (98 kg)    GENERAL:alert, no distress and comfortable SKIN: skin color, texture, turgor are normal, no rashes or significant lesions EYES: normal, Conjunctiva are pink and non-injected, sclera clear OROPHARYNX:no exudate, no erythema and lips, buccal mucosa, and tongue normal  NECK: supple, thyroid normal size, non-tender, without nodularity LYMPH:  no palpable lymphadenopathy in the cervical, axillary or inguinal LUNGS: clear to auscultation and percussion with  normal breathing effort HEART: regular rate & rhythm and no murmurs and no lower extremity edema ABDOMEN:abdomen soft, non-tender and normal bowel sounds MUSCULOSKELETAL:no cyanosis of digits and no clubbing  NEURO: alert & oriented x 3 with fluent speech, no focal motor/sensory deficits EXTREMITIES: No lower  extremity edema BREAST: No palpable masses or nodules in either right or left breasts. No palpable axillary supraclavicular or infraclavicular adenopathy no breast tenderness or nipple discharge. (exam performed in the presence of a chaperone)  LABORATORY DATA:  I have reviewed the data as listed   Chemistry      Component Value Date/Time   NA 136 10/26/2014 1551   K 4.1 10/26/2014 1551   CL 106 10/28/2010 1053   CO2 23 10/26/2014 1551   BUN 13.6 10/26/2014 1551   CREATININE 0.7 10/26/2014 1551      Component Value Date/Time   CALCIUM 9.2 10/26/2014 1551   ALKPHOS 88 10/26/2014 1551   AST 24 10/26/2014 1551   ALT 28 10/26/2014 1551   BILITOT 0.55 10/26/2014 1551       Lab Results  Component Value Date   WBC 7.9 10/26/2014   HGB 14.2 10/26/2014   HCT 42.3 10/26/2014   MCV 91.0 10/26/2014   PLT 253 10/26/2014   NEUTROABS 5.5 10/26/2014    ASSESSMENT & PLAN:  Breast cancer of upper-outer quadrant of left female breast 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 Breast exam 11/05/2016 Normal 2. Mammogram 04/21/2015 at [facility] No abnormalities. Postsurgical changes. Breast Density Category A.   Anastrozole toxicities: Denies any hot flashes or myalgias. Breast cancer index revealed that she had a high risk of recurrence and high likelihood of benefit from extended adjuvant therapy. Because of this I recommended extended duration of antiestrogen treatments. Since the last visit the results from [place] [facility] Were presented which showed that there was no difference been 7 years versus 10  years. Based on this I feel that she has completed her adjuvant therapy and that she could discontinue oral antiestrogen as of this time.  Return to clinic in 1 year for surveillance and follow up with survivorship clinic.  I spent 25 minutes talking to the patient of which more than half was spent in counseling and coordination of care.  No orders of the defined types were placed in this encounter.  The patient has a good understanding of the overall plan. she agrees with it. she will call with any problems that may develop before the next visit here.   Rulon Eisenmenger, MD 11/06/16

## 2016-11-06 ENCOUNTER — Telehealth: Payer: Self-pay

## 2016-11-06 NOTE — Telephone Encounter (Signed)
Called pt and lvm with call back number to notify her of her bone density score of -1.6 (mild osteopenia) Per Dr.Gudena, continue taking vitamin d and add calcium 1200mg  per day.

## 2016-12-15 ENCOUNTER — Encounter: Payer: Self-pay | Admitting: Hematology and Oncology

## 2017-05-14 ENCOUNTER — Other Ambulatory Visit: Payer: Self-pay | Admitting: Family Medicine

## 2017-05-14 DIAGNOSIS — Z8249 Family history of ischemic heart disease and other diseases of the circulatory system: Secondary | ICD-10-CM

## 2017-06-04 ENCOUNTER — Ambulatory Visit
Admission: RE | Admit: 2017-06-04 | Discharge: 2017-06-04 | Disposition: A | Discharge status: Home / Self Care | Payer: PRIVATE HEALTH INSURANCE | Source: Ambulatory Visit | Attending: Family Medicine | Admitting: Family Medicine

## 2017-06-04 DIAGNOSIS — Z8249 Family history of ischemic heart disease and other diseases of the circulatory system: Secondary | ICD-10-CM

## 2018-05-12 ENCOUNTER — Encounter: Payer: Self-pay | Admitting: Hematology and Oncology

## 2018-05-19 ENCOUNTER — Telehealth: Payer: Self-pay | Admitting: Hematology and Oncology

## 2018-05-19 NOTE — Telephone Encounter (Signed)
Scheduled appt per 2/19 sch message - left message for patient with appt date and time

## 2018-06-17 ENCOUNTER — Inpatient Hospital Stay: Payer: Self-pay | Admitting: Hematology and Oncology

## 2018-06-17 ENCOUNTER — Telehealth: Payer: Self-pay | Admitting: Hematology and Oncology

## 2018-06-17 NOTE — Telephone Encounter (Signed)
Set up f/u appt per 3/18 sch message - left message and sent reminder letter in the mail.

## 2018-10-25 ENCOUNTER — Telehealth: Payer: Self-pay | Admitting: Hematology and Oncology

## 2018-10-25 NOTE — Telephone Encounter (Signed)
Called pt per 7/27 sch message- no answer - left message for patient to call back to reschedule.

## 2018-11-18 ENCOUNTER — Ambulatory Visit: Payer: Self-pay | Admitting: Hematology and Oncology

## 2018-12-19 IMAGING — CT CT HEART SCORING
2 of 3 series · 7 of 20 positions shown, 9 images · non-contrast
Comparison: Plain film 04/20/2017.  No prior CT.

CLINICAL DATA: High cholesterol.

EXAM:
CT HEART FOR CALCIUM SCORING
TECHNIQUE: CT heart was performed on a 64 channel system using prospective ECG
gating.
A non-contrast exam for calcium scoring was performed.
Note that this exam targets the heart and the chest was not imaged
in its entirety.

[Series 3: calcium scoring 2.00 br40 bestdiast 69% ax fov · axial · 0.32mm/px · z∈[+1631,+1723]mm · 5 of 70 slices shown, 7 images]
[im 12/70  vessel]
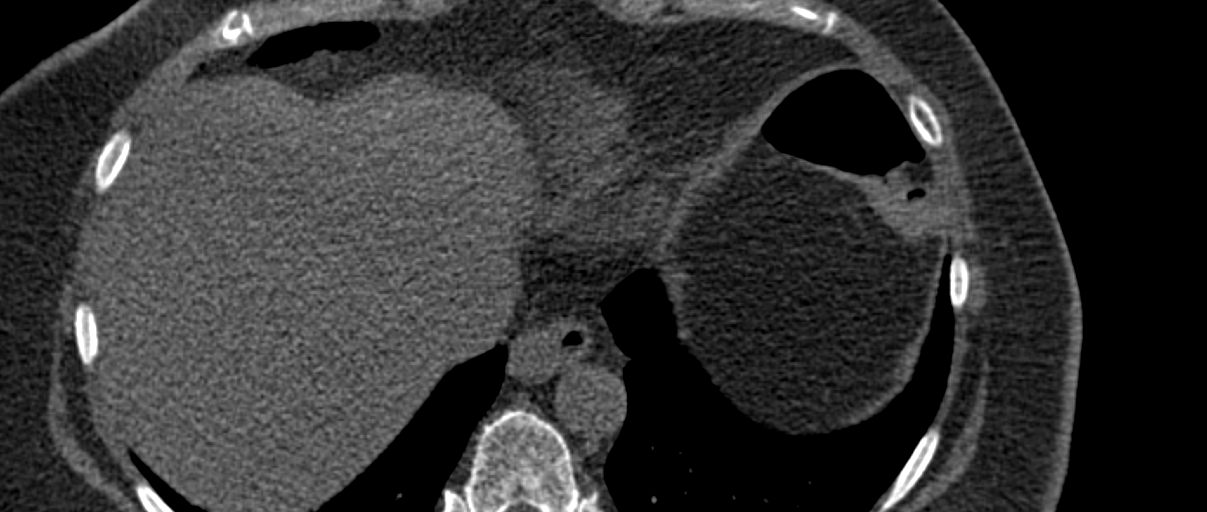
[im 12/70  lung]
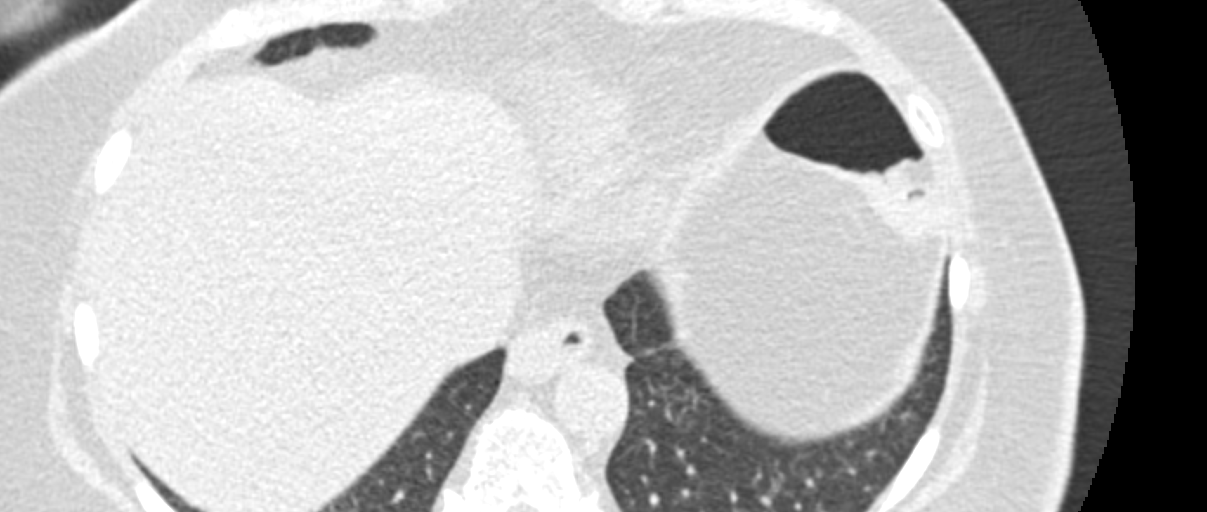
[im 24/70  vessel]
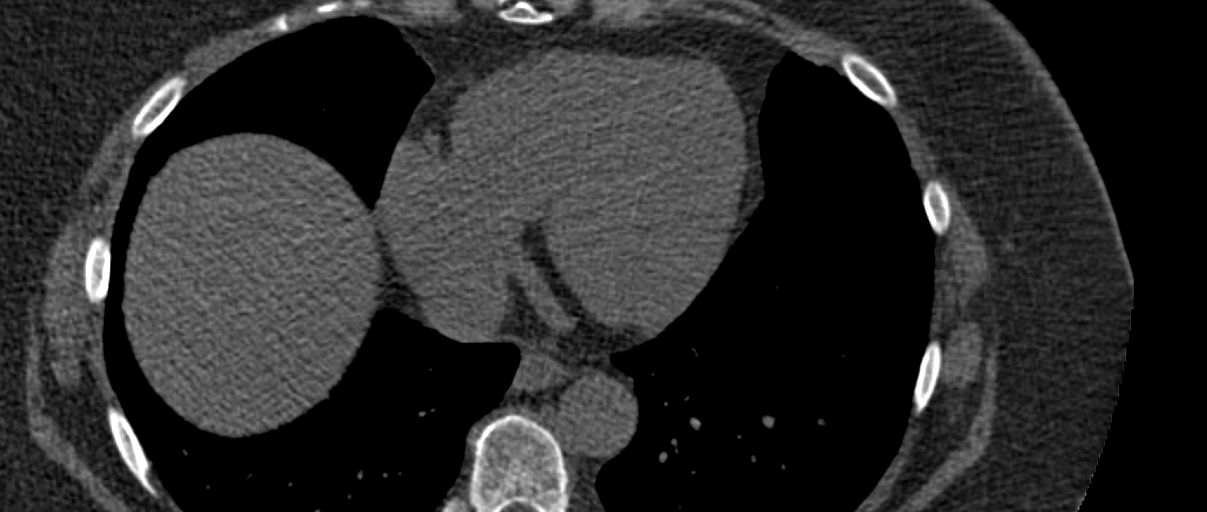
[im 35/70  vessel]
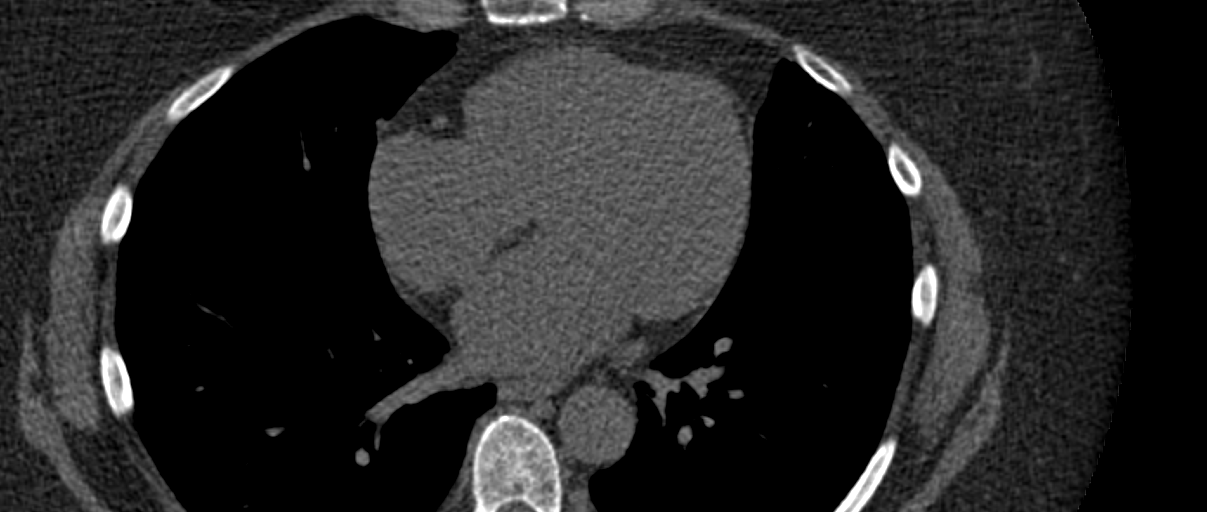
[im 47/70  vessel]
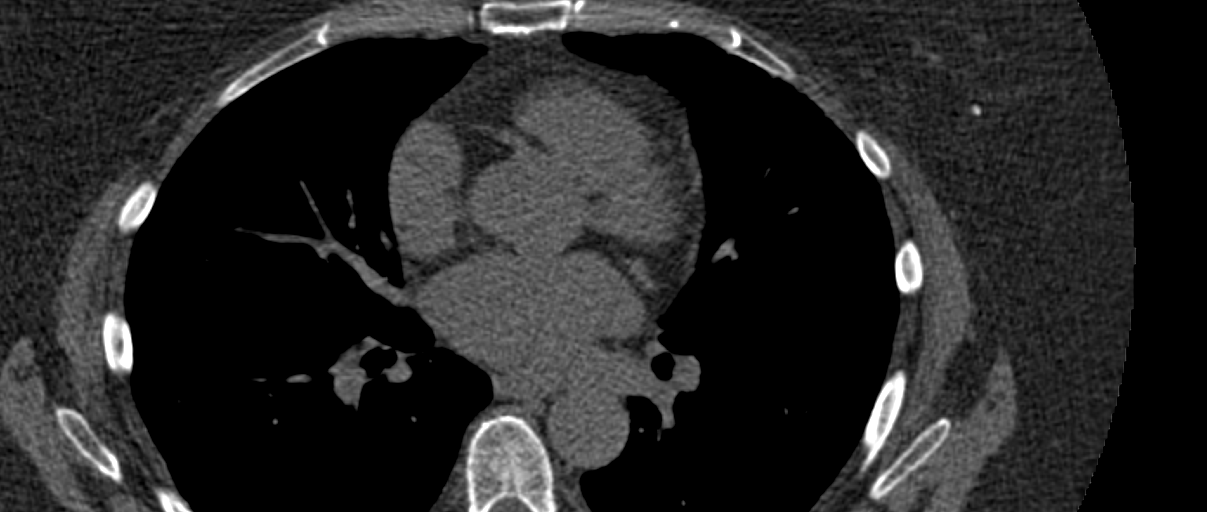
[im 58/70  vessel]
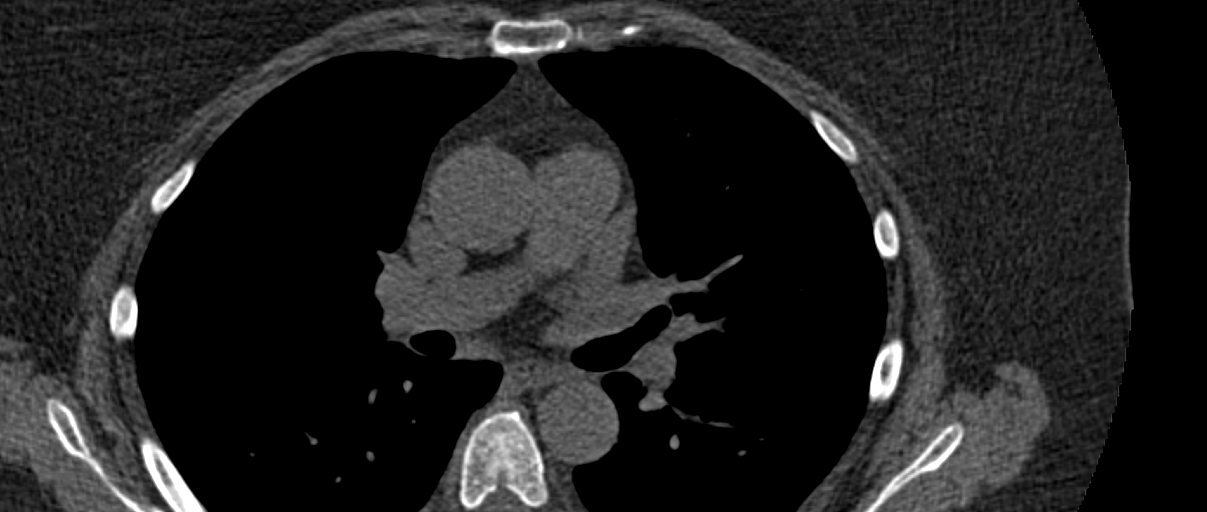
[im 58/70  lung]
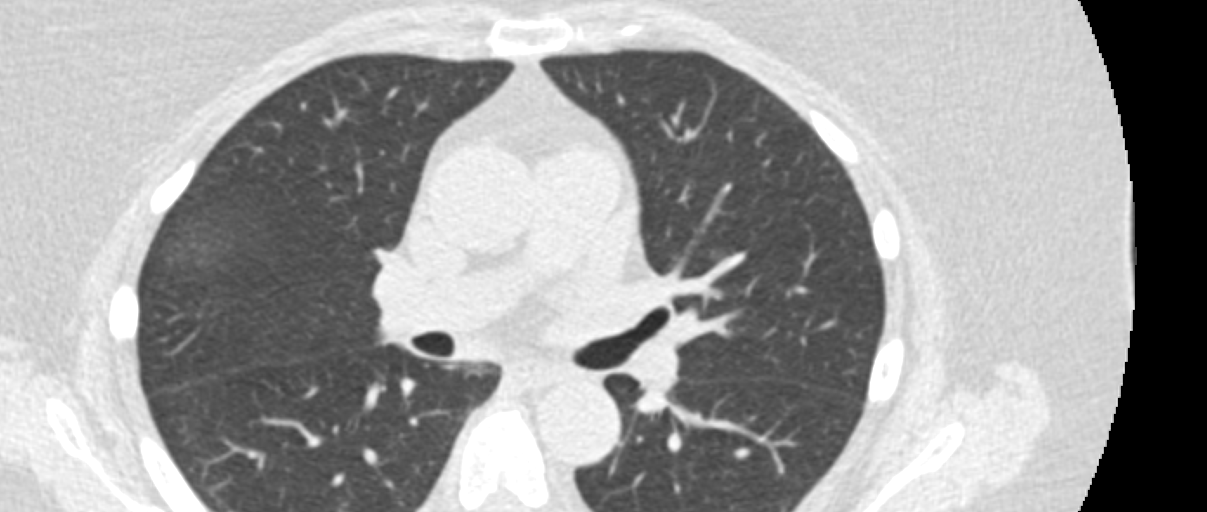

[Series 9: calcium scoring 2.00 br60 bestdiast 69% ax fov · axial · 0.32mm/px · z∈[+1631,+1655]mm · 2 of 70 slices shown]
[im 12/70  vessel]
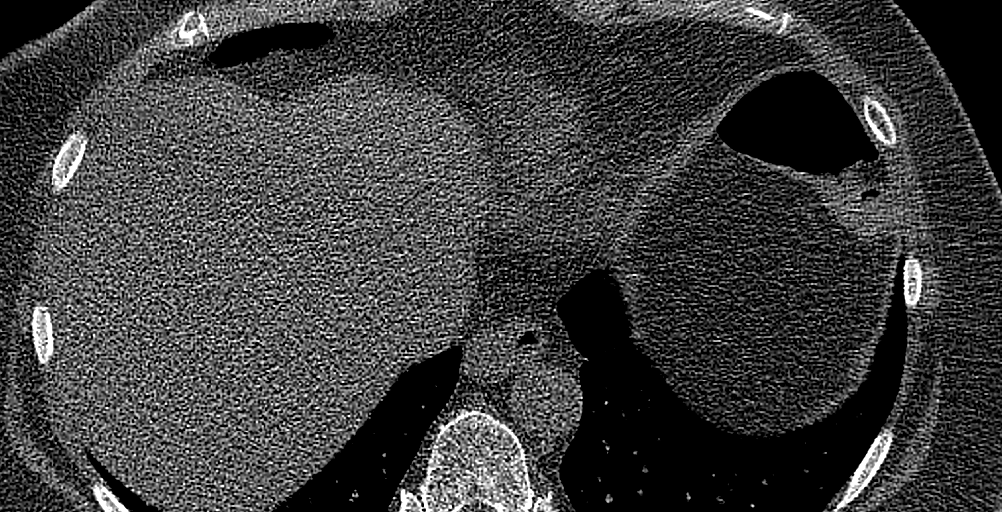
[im 24/70  vessel]
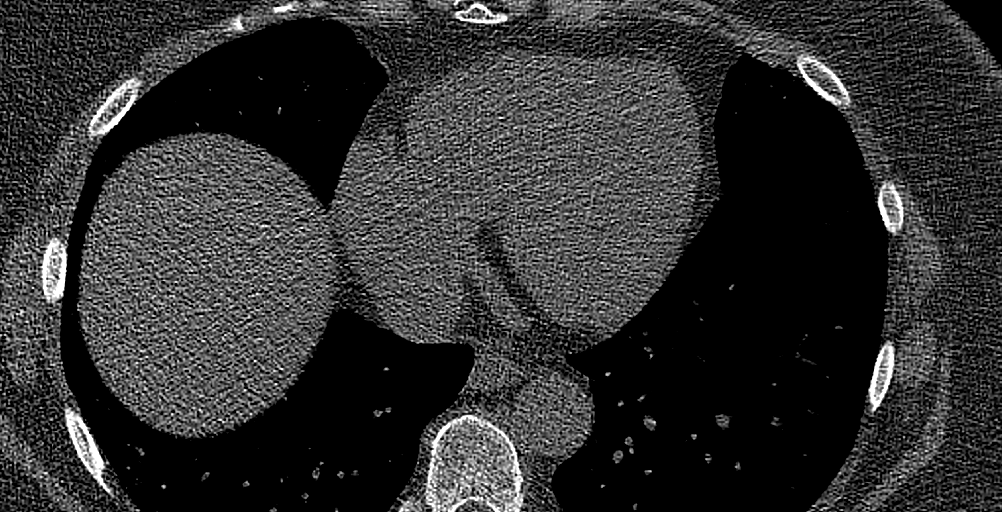

[7 of 20 positions shown; findings below may reference images not displayed]

FINDINGS: Technical quality: Good.

CORONARY CALCIUM

Total Agatston Score: 45.4-coronary calcium identified within the
right coronary artery.

[HOSPITAL] percentile:  70

OTHER FINDINGS:

Cardiovascular: Normal aortic caliber. Normal heart size, without
pericardial effusion.

Mediastinum/Nodes: No imaged thoracic adenopathy. Tiny hiatal
hernia.

Lungs/Pleura: No pleural fluid. Areas of subsegmental atelectasis in
both lower lobes.

Upper Abdomen: Normal imaged portions of the liver, spleen.

Musculoskeletal: No acute osseous abnormality. Mild right
hemidiaphragm elevation.
IMPRESSION: 1. Total Agatston score of 45.5, corresponding to 70th percentile
for age, sex, and race based cohort.
2.  Tiny hiatal hernia.

## 2019-02-10 ENCOUNTER — Encounter: Payer: Self-pay | Admitting: Family Medicine

## 2019-02-10 ENCOUNTER — Ambulatory Visit: Payer: Self-pay | Admitting: Family Medicine

## 2019-02-10 VITALS — BP 132/90 | HR 66 | Resp 18

## 2019-02-10 DIAGNOSIS — S99922A Unspecified injury of left foot, initial encounter: Secondary | ICD-10-CM

## 2019-02-10 NOTE — Progress Notes (Signed)
  Subjective:     Patient ID: Holly Woodard, female   DOB: November 10, 1950, 68 y.o.   MRN: [id number]  HPI Holly Woodard presents to the [facility] clinic today for evaluation of left middle toe pain d/t recent injury and to discuss her wellness. She reports she stubbed her toe on a piece of furniture two weeks ago and left middle toe is still painful and swollen. Does not affect her gait, able to bear weight. Reports pain is worse at night and radiates up her ankle at times. Denies any numbness or tingling.   She reports regular check ups with her PCP with lab work. Has hx of hyperlipidemia which she was taking lipitor once weekly, but has since stopped this d/t intolerance. She states her A1c was borderline at 5.7 and her BP has always been somewhat elevated but has never needed medication. She would like to schedule appointments to check her wellness labs and weight.   Review of Systems  Constitutional: Negative for activity change and unexpected weight change.  Respiratory: Negative for shortness of breath.   Cardiovascular: Negative for chest pain.  Musculoskeletal: Positive for arthralgias and joint swelling. Negative for gait problem.  Skin: Negative for color change.       Objective:   Physical Exam Vitals signs reviewed.  Constitutional:      General: She is not in acute distress.    Appearance: She is obese. She is not toxic-appearing.  HENT:     Head: Normocephalic and atraumatic.  Cardiovascular:     Rate and Rhythm: Normal rate and regular rhythm.     Heart sounds: Normal heart sounds.  Pulmonary:     Effort: Pulmonary effort is normal. No respiratory distress.     Breath sounds: Normal breath sounds.  Musculoskeletal:     Comments: Left foot: middle toe with noticeable swelling. Tender to palpation over third MTP joint. Sensation and ROM intact.   Skin:    General: Skin is warm and dry.  Neurological:     Mental Status: She is alert and oriented to person, place, and time.   Psychiatric:        Mood and Affect: Mood normal.        Behavior: Behavior normal.        Assessment:     Injury of toe on left foot, initial encounter      Plan:     1. Discussed with patient that I recommend she get an x-ray of her left foot to evaluate her toe injury to determine if there is a fracture and the extent of that fracture. She should call her PCP for this or go to urgent care. I buddy taped her third and second toe together for increased stabilization. Recommended RICE and tylenol or ibuprofen for pain management. F/u prn.   2. Wellness: she will schedule an appointment next week to obtain some wellness labs and get her weight and BP checked. We can discuss some of her wellness goals at that time.

## 2019-02-17 ENCOUNTER — Ambulatory Visit: Payer: Self-pay | Admitting: Family Medicine

## 2019-02-17 ENCOUNTER — Other Ambulatory Visit: Payer: Self-pay | Admitting: Family Medicine

## 2019-02-17 DIAGNOSIS — E785 Hyperlipidemia, unspecified: Secondary | ICD-10-CM

## 2019-02-17 NOTE — Progress Notes (Signed)
  Subjective:     Patient ID: Holly Woodard, female   DOB: 1950-07-09, 68 y.o.   MRN: [id number]  HPI [name] presents to the [facility] clinic today to discuss her wellness goals and to have some lab work done. She reports she did have her toe x-rayed and it showed a healing fracture. She states her pain is improving everyday.   [name] would like to work on weight loss and preventing diabetes. She would like to come in monthly for weight checks and accountability for these goals.   Review of Systems     Objective:   Physical Exam Vitals signs reviewed.  Constitutional:      General: She is not in acute distress.    Appearance: Normal appearance. She is obese. She is not toxic-appearing.  HENT:     Head: Normocephalic and atraumatic.  Pulmonary:     Effort: Pulmonary effort is normal. No respiratory distress.  Skin:    General: Skin is warm and dry.  Neurological:     Mental Status: She is alert and oriented to person, place, and time.  Psychiatric:        Mood and Affect: Mood normal.        Behavior: Behavior normal.        Assessment:     Morbid obesity (HCC)  Hyperlipidemia, unspecified hyperlipidemia type      Plan:     1. Will check A1c and lipid panel. She has stopped taking her lipitor a few weeks ago d/t intolerance per pt.  2. Wellness goals: She will walk 10 min a day 3-5 days a week. She will do a chair yoga or chair tai chi class twice a week. We will f/u on these goals next month. I will notify her of her labs when they result.

## 2019-02-18 LAB — LIPID PANEL
Cholesterol: 211 mg/dL — ABNORMAL HIGH (ref <200)
HDL: 58 mg/dL (ref >=50)
LDL Cholesterol (Calc): 131 mg/dL (calc) — ABNORMAL HIGH
Non-HDL Cholesterol (Calc): 153 mg/dL (calc) — ABNORMAL HIGH (ref <130)
Total CHOL/HDL Ratio: 3.6 (calc) (ref <5.0)
Triglycerides: 112 mg/dL (ref <150)

## 2019-02-18 LAB — HEMOGLOBIN A1C W/OUT EAG: Hgb A1c MFr Bld: 5.5 % of total Hgb (ref <5.7)

## 2019-04-21 ENCOUNTER — Ambulatory Visit: Payer: Self-pay | Admitting: Family Medicine

## 2019-04-21 VITALS — BP 132/90 | HR 78 | Wt 214.3 lb

## 2019-04-21 DIAGNOSIS — Z789 Other specified health status: Secondary | ICD-10-CM

## 2019-04-21 NOTE — Progress Notes (Signed)
  Subjective:     Patient ID: Holly Woodard, female   DOB: Aug 22, 1950, 69 y.o.   MRN: [id number]  HPI Onell [facility] today for her required wellness visit for insurance. We reviewed her PMH and health maintenance items as well as wellness goals. She does have a PCP that she sees regularly.   Per pt:  Eye exam 03/02/19- beginning cataracts Flu shot 01/18/19 Mammogram 05/12/18-normal; hx of breast cancer Colonoscopy 06/08/17- polyp, 5 year f/u  1st dose covid-19 vaccine PNA and shingles 2007 tdap 2012  She has a wellness goal to decrease her anxiety. She states she is using the Calm meditation app 10 min every morning and feels like this is helpful for her. She would also like to increase her walking and work on weight loss.   Past Medical History:  Diagnosis Date  . Arthritis    Degenerative disk   . Breast cancer ([place])   No Known Allergies  Current Outpatient Medications:  .  cholecalciferol (VITAMIN D) 1000 UNITS tablet, Take 5,000 Units by mouth once a week. , Disp: , Rfl:  .  fexofenadine (ALLEGRA) 180 MG tablet, Take 180 mg by mouth daily., Disp: , Rfl:    Review of Systems  Constitutional: Negative for chills, fatigue, fever and unexpected weight change.  HENT: Negative for congestion, ear pain, sinus pressure, sinus pain and sore throat.   Eyes: Negative for discharge and visual disturbance.  Respiratory: Negative for cough, shortness of breath and wheezing.   Cardiovascular: Negative for chest pain and leg swelling.  Gastrointestinal: Negative for abdominal pain, blood in stool, constipation, diarrhea, nausea and vomiting.  Genitourinary: Negative for difficulty urinating and hematuria.  Skin: Negative for color change.  Neurological: Negative for dizziness, weakness, light-headedness and headaches.  Hematological: Negative for adenopathy.  All other systems reviewed and are negative.      Objective:   Physical Exam Vitals  reviewed.  Constitutional:      General: She is not in acute distress.    Appearance: Normal appearance. She is obese. She is not toxic-appearing.  HENT:     Head: Normocephalic and atraumatic.  Eyes:     General:        Right eye: No discharge.        Left eye: No discharge.  Cardiovascular:     Rate and Rhythm: Normal rate.  Pulmonary:     Effort: Pulmonary effort is normal. No respiratory distress.  Skin:    General: Skin is warm and dry.  Neurological:     Mental Status: She is alert and oriented to person, place, and time.  Psychiatric:        Mood and Affect: Mood normal.        Behavior: Behavior normal.        Assessment:     Participant in health and wellness plan      Plan:     1. Encouraged continued efforts at weight loss and daily meditation for reducing stress and anxiety. Keep all appts with PCP, discuss cholesterol medication with PCP. Keep eye on BP. F/u prn

## 2019-05-17 ENCOUNTER — Encounter: Payer: Self-pay | Admitting: Hematology and Oncology

## 2019-05-25 ENCOUNTER — Other Ambulatory Visit: Payer: Self-pay | Admitting: *Deleted

## 2019-05-25 ENCOUNTER — Telehealth: Payer: Self-pay | Admitting: Hematology and Oncology

## 2019-05-25 DIAGNOSIS — C50412 Malignant neoplasm of upper-outer quadrant of left female breast: Secondary | ICD-10-CM

## 2019-05-25 DIAGNOSIS — Z170 Estrogen receptor positive status [ER+]: Secondary | ICD-10-CM

## 2019-05-25 NOTE — Progress Notes (Signed)
Received recent mammogram report from [facility] stating there "5 mm mass in the right breat most likely is a lymph node and is indeterminate.  An ultrasound is recommended".  Per MD, right breast ultrasound to be ordered.  Orders placed and successfully faxed to [facility] to be scheduled for next week.

## 2019-05-25 NOTE — Telephone Encounter (Signed)
Scheduled appt per 2/24 sch  msg - unable to reach pt . Left message with appt date and time

## 2019-06-15 ENCOUNTER — Other Ambulatory Visit: Payer: Self-pay

## 2019-06-15 ENCOUNTER — Encounter: Payer: Self-pay | Admitting: Physical Therapy

## 2019-06-15 ENCOUNTER — Ambulatory Visit: Payer: PRIVATE HEALTH INSURANCE | Attending: Family Medicine | Admitting: Physical Therapy

## 2019-06-15 DIAGNOSIS — M6281 Muscle weakness (generalized): Secondary | ICD-10-CM | POA: Insufficient documentation

## 2019-06-15 DIAGNOSIS — R278 Other lack of coordination: Secondary | ICD-10-CM | POA: Diagnosis present

## 2019-06-15 NOTE — Patient Instructions (Signed)
Urge Incontinence  . Ideal urination frequency is every 2-4 wakeful hours, which equates to 5-8 times within a 24-hour period.   . Urge incontinence is leakage that occurs when the bladder muscle contracts, creating a sudden need to go before getting to the bathroom.   . Going too often when your bladder isn't actually full can disrupt the body's automatic signals to store and hold urine longer, which will increase urgency/frequency.  In this case, the bladder "is running the show" and strategies need to be learned to retrain this pattern.   . One should be able to control the first urge to urinate, at around 182mL.  The bladder can hold up to a "grande latte," or 458mL. . To help you gain control, practice the Urge Drill below when urgency strikes.  This drill will help retrain your bladder signals and allow you to store and hold urine longer.  The overall goal is to stretch out your time between voids to reach a more manageable voiding schedule.    . Practice your "quick flicks" often throughout the day (each waking hour) even when you don't need feel the urge to go.  This will help strengthen your pelvic floor muscles, making them more effective in controlling leakage.  Urge Drill  When you feel an urge to go, follow these steps to regain control: 1. Stop what you are doing and be still 2. Take one deep breath, directing your air into your abdomen 3. Think an affirming thought, such as "I've got this." 4. Do 5 quick flicks of your pelvic floor 5. Walk with control to the bathroom to void, or delay voiding   THE KNACK  The Knack is a strategy you may use to help to reduce or prevent leakage or passing of urine, gas or feces during an activity that causes downward force on the pelvic floor muscles.    Activities that can cause downward pressure on the pelvic floor muscles include coughing, sneezing, laughing, bending, lifting, and transitioning from different body positions such as from  laying down to sitting up and sitting to standing.  To perform The Knack, consciously squeeze and lift your pelvic floor muscles to perform a strong, well-timed pelvic muscle contraction BEFORE AND DURING these activities above.  As your contraction gets more coordinated and your muscles get stronger, you will become more effective in controlling your experience of incontinence or gas passing during these activities.     Access Code: [email or web address]: [email or web address]: 03/17/2021Prepared by: Venetia Night BeuhringExercises  Sidelying Transversus Abdominis Bracing - 2 x daily - 7 x weekly - 1 sets - 10 reps - 10 hold  Supine Figure 4 Piriformis Stretch - 1 x daily - 7 x weekly - 1 sets - 3 reps - 30 hold  Supine Piriformis Stretch with Foot on Ground - 1 x daily - 7 x weekly - 1 sets - 3 reps - 30 hold

## 2019-06-15 NOTE — Therapy (Signed)
[facility] [address], [place], [place], [address] Phone: [phone]   Fax:  [phone]  Physical Therapy Evaluation  Patient Details  Name: Holly Woodard MRN: [id number] Date of Birth: 1951/01/28 Referring Provider (PT): Kathyrn Lass, MD   Encounter Date: 06/15/2019  PT End of Session - 06/15/19 0750    Visit Number  1    Date for PT Re-Evaluation  08/10/19    PT Start Time  0750   Pt early   PT Stop Time  0840    PT Time Calculation (min)  50 min    Activity Tolerance  Patient tolerated treatment well    Behavior During Therapy  [facility] for tasks assessed/performed       Past Medical History:  Diagnosis Date  . Arthritis    Degenerative disk   . Breast cancer [facility])     Past Surgical History:  Procedure Laterality Date  . BREAST LUMPECTOMY Left 04/25/2009    There were no vitals filed for this visit.   Subjective Assessment - 06/15/19 0748    Subjective  History of stress incontinence x many years.  Leaks with laugh, cough, sneeze.  Laughing is biggest trigger.  Quick gush of leakage.  Worsening over last year and now has leakage when attempting to get to the bathroom intermittently and may be a nearly full bladder empying.  Wears a moderate Poise pad.  Some days no leakage or min leakage, other days fills pad and some overflow onto clothing.  Maybe 1x/week for this.    Pertinent History  2011 breast cancer    How long can you walk comfortably?  not regularly a walker for exercise but does occassionally, not limited.    Patient Stated Goals  reducing or eliminating leakage    Currently in Pain?  No/denies         [facility] PT Assessment - 06/15/19 0001      Assessment   Medical Diagnosis  N39.46 (ICD-10-CM) - Mixed incontinence    Referring Provider (PT)  Kathyrn Lass, MD    Onset Date/Surgical Date  --   years   Next MD Visit  as needed    Prior Therapy  no      Precautions   Precautions   None      Restrictions   Weight Bearing Restrictions  No      Balance Screen   Has the patient fallen in the past 6 months  Yes    How many times?  1    Has the patient had a decrease in activity level because of a fear of falling?   Yes    Is the patient reluctant to leave their home because of a fear of falling?   No      Home Environment   Living Environment  Private residence    [place]  Two level    Alternate Level Stairs-Number of Steps  15      Prior Function   Level of Independence  Independent    Vocation  Full time employment    Arboriculturist    Leisure  travel, go to sporting events, movies, concerts, going to restaurants      Cognition   Overall Cognitive Status  Within Functional Limits for tasks assessed      Posture/Postural Control   Posture/Postural Control  Postural limitations    Postural Limitations  Decreased thoracic  kyphosis;Decreased lumbar lordosis      ROM / Strength   AROM / PROM / Strength  AROM;Strength      AROM   Overall AROM Comments  trunk ROM WFL all planes, bil hip limitation in flexion and ER      Strength   Overall Strength Comments  bil hip strength hip flexors 4/5, extension 4/5, abduction 4/5      Flexibility   Soft Tissue Assessment /Muscle Length  yes   gluteals limited bil   Piriformis  limited bil      Palpation   Spinal mobility  WFL    Palpation comment  TTP: bil adductor magnus      Balance   Balance Assessed  Yes      Static Standing Balance   Static Standing Balance -  Activities   Single Leg Stance - Right Leg;Single Leg Stance - Left Leg    Static Standing - Comment/# of Minutes  poor SLS bil                Objective measurements completed on examination: See above findings.    Pelvic Floor Special Questions - 06/15/19 0001    Prior Pelvic/Prostate Exam  Yes    Date of Last Pelvic/Prostate Exam  --   3 years ago   Result Pelvic/Prostate  Exam   negative    Prior Urinalysis  No    Diagnostic Test/Procedure Performed  No    Are you Pregnant or attempting pregnancy?  No    Prior Pregnancies  Yes    Number of Pregnancies  1    Number of Vaginal Deliveries  1    Any difficulty with labor and deliveries  No   yes   Episiotomy Performed  Yes    Currently Sexually Active  No    History of sexually transmitted disease  No    Urinary Leakage  Yes    How often  daily    Pad use  daily    Activities that cause leaking  With strong urge;Coughing;Sneezing;Laughing;Exercising;Other   jumping   Other activities that cause leaking  getting to toilet    Urinary urgency  Yes    Urinary frequency  all day long, every hour to 1.5 hours    Fecal incontinence  No    Caffeine beverages  2 cups of coffee in AM, 2 cups of water with lunch, 1 cup with dinner, some milk    Falling out feeling (prolapse)  No    Skin Integrity  Intact    Scar  Episiotomy;Well healed    Perineal Body/Introitus   Descended    External Palpation  non tender    Prolapse  None    Pelvic Floor Internal Exam  PT explained assessment and obtained verbal consent before and throughout exam    Exam Type  Vaginal    Sensation  intact    Palpation  some tenderness at vaginal introitus    Strength  Flicker   mostly from posterior PF, signif abdominal compensation   Strength # of reps  2    Strength # of seconds  5               PT Education - 06/15/19 0929    Education Details  Access Code: [email or web address], urge incontinence and urge drill info, [employer]    Person(s) Educated  Patient    Methods  Explanation;Demonstration;Verbal cues;Handout    Comprehension  Verbalized understanding;Returned demonstration  PT Short Term Goals - 06/15/19 1110      PT SHORT TERM GOAL #1   Title  Pt will be independent in initial HEP    Time  3    Period  Weeks    Status  New    Target Date  07/06/19      PT SHORT TERM GOAL #2   Title  Pt will demo improved  recruitment of anterior pelvic floor muscles.    Time  4    Period  Weeks    Status  New    Target Date  07/13/19      PT SHORT TERM GOAL #3   Title  Pt will report use of The Knack and Urge Drill with at least 20% reduced incidence of leakage with triggering activities.    Status  New    Target Date  07/13/19      PT SHORT TERM GOAL #4   Title  Pt will be educated on bladder irritants and fill out a bladder diary as needed to establish healthy bladder routine.    Time  3    Period  Weeks    Status  New    Target Date  07/06/19        PT Long Term Goals - 06/15/19 1113      PT LONG TERM GOAL #1   Title  Pt will be independent in advanced HEP targeting pelvic floor strength and coordination, core muscle strength, flexibility and dynamic functional strength.    Time  8    Period  Weeks    Status  New    Target Date  08/10/19      PT LONG TERM GOAL #2   Title  Pt will report at least 70% reduction in both stress and urge incontinence.    Time  8    Period  Weeks    Status  New    Target Date  08/10/19      PT LONG TERM GOAL #3   Title  Pt will achieve LE flexibility for bil hips to [place] to improve dynamic mobility for daily activities.    Baseline  limited gluteals, piriformis    Time  8    Period  Weeks    Status  New      PT LONG TERM GOAL #4   Title  Pt will achieve at least 4+/5 strength for bil hips for improved dynamic tasks such as sit to stand, walking, stairs.    Baseline  hip flexion, extension, abduction 4/5 bil    Time  8    Period  Weeks    Status  New    Target Date  08/10/19      PT LONG TERM GOAL #5   Title  Pt will improved PERFECT score to at least 3/10/8//8 for improved endurance and strength of pelvic floor to better reduce or eliminate incontinence episodes.    Time  8    Period  Weeks    Status  New    Target Date  08/10/19             Plan - 06/15/19 1119    Clinical Impression Statement  Pt presents to PT for mixed incontinence.   Stress incontinence has been present for years and has worsened over last year.  Urge incontinence is new and less frequent (approx 1x/week).  Leakage is min-mod.  Pt wears a moderate Poise pad daily without need to change pad some  days.  When urge strikes she has enough leakage to soak through pad.  Pt presents with deficits in posture, hip ROM, hip strength, balance in SLS, trunk stabilizer strength, and pelvic floor muscle strength.  PERFECT score is 1/5/2//5.  PT notes Pt uses abdominal compensation with efforts to contract PF.  PF contraction is 0-1/5 in anterior aspect and 1/5 in posterior pelvic floor.  PT gave info on The Knack, Urge Drill and intial HEP for TrA/PF and adductor activation in SL along with hip stretches.  Pt will benefit from skilled PT to address deficits and improve bladder control throughout the day.    Personal Factors and Comorbidities  Time since onset of injury/illness/exacerbation    Examination-Activity Limitations  Continence    Examination-Participation Restrictions  Community Activity    Stability/Clinical Decision Making  Stable/Uncomplicated    Clinical Decision Making  Low    Rehab Potential  Good    PT Frequency  1x / week    PT Duration  8 weeks    PT Treatment/Interventions  ADLs/Self Care Home Management;Biofeedback;Electrical Stimulation;Moist Heat;Functional mobility training;Therapeutic exercise;Balance training;Neuromuscular re-education;Manual techniques;Patient/family education;Passive range of motion;Dry needling;Joint Manipulations;Spinal Manipulations;Taping    PT Next Visit Plan  recheck PF contraction, review TrA and PF training and progress as able, supine ball squeeze, biofeedback, vaginal moisturizer for vulvar skin care, give bladder irritants, discuss timed voiding    PT Home Exercise Plan  Access Code: [email or web address], urge and knack    Consulted and Agree with Plan of Care  Patient       Patient will benefit from skilled therapeutic intervention  in order to improve the following deficits and impairments:  Decreased coordination, Decreased endurance, Postural dysfunction, Decreased strength, Impaired flexibility, Decreased balance  Visit Diagnosis: Muscle weakness (generalized) - Plan: PT plan of care cert/re-cert  Other lack of coordination - Plan: PT plan of care cert/re-cert     Problem List Patient Active Problem List   Diagnosis Date Noted  . Breast cancer of upper-outer quadrant of left female breast ([place]) 01/06/2013    Baruch Merl, PT 06/15/19 11:33 AM   [facility] [address], [place], [place], [address] Phone: [phone]   Fax:  [phone]  Name: Holly Woodard MRN: [id number] Date of Birth: 01/29/1951

## 2019-07-04 ENCOUNTER — Ambulatory Visit: Payer: Self-pay | Admitting: Hematology and Oncology

## 2019-07-06 ENCOUNTER — Ambulatory Visit: Payer: PRIVATE HEALTH INSURANCE | Admitting: Physical Therapy

## 2019-07-06 ENCOUNTER — Encounter: Payer: Self-pay | Admitting: Physical Therapy

## 2019-07-06 ENCOUNTER — Other Ambulatory Visit: Payer: Self-pay

## 2019-07-06 DIAGNOSIS — M6281 Muscle weakness (generalized): Secondary | ICD-10-CM

## 2019-07-06 DIAGNOSIS — R278 Other lack of coordination: Secondary | ICD-10-CM

## 2019-07-06 DIAGNOSIS — Z9221 Personal history of antineoplastic chemotherapy: Secondary | ICD-10-CM | POA: Diagnosis not present

## 2019-07-06 DIAGNOSIS — Z170 Estrogen receptor positive status [ER+]: Secondary | ICD-10-CM | POA: Diagnosis not present

## 2019-07-06 DIAGNOSIS — C50412 Malignant neoplasm of upper-outer quadrant of left female breast: Secondary | ICD-10-CM | POA: Diagnosis present

## 2019-07-06 DIAGNOSIS — Z923 Personal history of irradiation: Secondary | ICD-10-CM | POA: Diagnosis not present

## 2019-07-06 DIAGNOSIS — Z79811 Long term (current) use of aromatase inhibitors: Secondary | ICD-10-CM | POA: Diagnosis not present

## 2019-07-06 NOTE — Therapy (Signed)
[facility] [address] [place], [place], [address] Phone: [phone]   Fax:  [phone]  Physical Therapy Treatment  Patient Details  Name: Holly Woodard MRN: [id number] Date of Birth: 03-Oct-1950 Referring Provider (PT): Kathyrn Lass, MD   Encounter Date: 07/06/2019  PT End of Session - 07/06/19 1241    Visit Number  2    Date for PT Re-Evaluation  08/10/19    Authorization Type  [employer] 30 visit limit    Authorization - Visit Number  2    Authorization - Number of Visits  30    PT Start Time  0800    PT Stop Time  0845    PT Time Calculation (min)  45 min    Activity Tolerance  Patient tolerated treatment well    Behavior During Therapy  [facility] for tasks assessed/performed       Past Medical History:  Diagnosis Date  . Arthritis    Degenerative disk   . Breast cancer [facility])     Past Surgical History:  Procedure Laterality Date  . BREAST LUMPECTOMY Left 04/25/2009    There were no vitals filed for this visit.  Subjective Assessment - 07/06/19 0803    Subjective  I haven't been great about my PF contractions but when I do I feel sometimes more successful in getting the front of the pelvic floor activated.  Still using abs and rectal area more.    Pertinent History  2011 breast cancer    How long can you walk comfortably?  not regularly a walker for exercise but does occassionally, not limited.    Patient Stated Goals  reducing or eliminating leakage    Currently in Pain?  No/denies                       [facility] Adult PT Treatment/Exercise - 07/06/19 0001      Self-Care   Self-Care  Other Self-Care Comments    Other Self-Care Comments   filling out bladder log, bladder irritants, vaginal moisturizers and benefits (GCL sample given)      Neuro Re-ed    Neuro Re-ed Details   biofeedback in supine and SL with PF cues (nod clitoris and stop from urinating or passing gas), breath  coordination with PF (inhale, exhale draw in, PF), with SL isom clam and adductor towel squeeze      Exercises   Exercises  Lumbar;Knee/Hip      Knee/Hip Exercises: Stretches   Piriformis Stretch  Both;1 rep;30 seconds    Other Knee/Hip Stretches  hip adductor stretch bil with strap (HEP) 1x30 sec each    Other Knee/Hip Stretches  fig 4 push away 1x30 sec bil      Knee/Hip Exercises: Sidelying   Hip ADduction Limitations  isom towel squeeze with PF and TrA 5x10 sec, PT cued breathing pattern and count aloud    Other Sidelying Knee/Hip Exercises  isom clam with TrA and PF 5x10 sec holds with count aloud               PT Short Term Goals - 06/15/19 1110      PT SHORT TERM GOAL #1   Title  Pt will be independent in initial HEP    Time  3    Period  Weeks    Status  New    Target Date  07/06/19      PT SHORT TERM GOAL #2   Title  Pt will demo improved recruitment of anterior pelvic floor muscles.    Time  4    Period  Weeks    Status  New    Target Date  07/13/19      PT SHORT TERM GOAL #3   Title  Pt will report use of The Knack and Urge Drill with at least 20% reduced incidence of leakage with triggering activities.    Status  New    Target Date  07/13/19      PT SHORT TERM GOAL #4   Title  Pt will be educated on bladder irritants and fill out a bladder diary as needed to establish healthy bladder routine.    Time  3    Period  Weeks    Status  New    Target Date  07/06/19        PT Long Term Goals - 06/15/19 1113      PT LONG TERM GOAL #1   Title  Pt will be independent in advanced HEP targeting pelvic floor strength and coordination, core muscle strength, flexibility and dynamic functional strength.    Time  8    Period  Weeks    Status  New    Target Date  08/10/19      PT LONG TERM GOAL #2   Title  Pt will report at least 70% reduction in both stress and urge incontinence.    Time  8    Period  Weeks    Status  New    Target Date  08/10/19       PT LONG TERM GOAL #3   Title  Pt will achieve LE flexibility for bil hips to [facility] to improve dynamic mobility for daily activities.    Baseline  limited gluteals, piriformis    Time  8    Period  Weeks    Status  New      PT LONG TERM GOAL #4   Title  Pt will achieve at least 4+/5 strength for bil hips for improved dynamic tasks such as sit to stand, walking, stairs.    Baseline  hip flexion, extension, abduction 4/5 bil    Time  8    Period  Weeks    Status  New    Target Date  08/10/19      PT LONG TERM GOAL #5   Title  Pt will improved PERFECT score to at least 3/10/8//8 for improved endurance and strength of pelvic floor to better reduce or eliminate incontinence episodes.    Time  8    Period  Weeks    Status  New    Target Date  08/10/19            Plan - 07/06/19 1242    Clinical Impression Statement  Pt reports no big gushes with urgency on making her way to toilet since eval.  Biofeedback used today in supine and SL.  Pt with improved anterior PF contraction and ability to coordinate TrA and PF contractions with breathing in SL.  Biofeedback revealed further PF activity with isom additions of towel adductor squeeze and yellow band isom clam.  Pt updated HEP for goal of working toward 10x10 of each isom in SL with count aloud to ensure Pt is breathing while holding core and PF.  PT also added hip adductor stretch with strap.  Pt will continue to benefit from skilled PT along current POC.    Personal Factors and Comorbidities  Time  since onset of injury/illness/exacerbation    Rehab Potential  Good    PT Frequency  1x / week    PT Duration  8 weeks    PT Treatment/Interventions  ADLs/Self Care Home Management;Biofeedback;Electrical Stimulation;Moist Heat;Functional mobility training;Therapeutic exercise;Balance training;Neuromuscular re-education;Manual techniques;Patient/family education;Passive range of motion;Dry needling;Joint Manipulations;Spinal Manipulations;Taping     PT Next Visit Plan  review bladder log, consider timed void schedule, internal recheck of PF contraction in SL for endurance and quick flicks, intro seated on towel ther ex, f/u on GCL sample, biofeedback    PT Home Exercise Plan  Access Code: [email or web address], urge and knack, bladder irritants, bladder log    Consulted and Agree with Plan of Care  Patient       Patient will benefit from skilled therapeutic intervention in order to improve the following deficits and impairments:     Visit Diagnosis: Muscle weakness (generalized)  Other lack of coordination     Problem List Patient Active Problem List   Diagnosis Date Noted  . Breast cancer of upper-outer quadrant of left female breast ([place]) 01/06/2013    Baruch Merl, PT 07/06/19 12:50 PM   [facility] [address] [place], [place], [address] Phone: [phone]   Fax:  [phone]  Name: Holly Woodard MRN: [id number] Date of Birth: 06/12/1950

## 2019-07-06 NOTE — Patient Instructions (Signed)
Bladder Irritants  Certain foods and beverages can be irritating to the bladder.  Avoiding these irritants may decrease your symptoms of urinary urgency, frequency or bladder pain.  Even reducing your intake can help with your symptoms.  Not everyone is sensitive to all bladder irritants, so you may consider focusing on one irritant at a time, removing or reducing your intake of that irritant for 7-10 days to see if this change helps your symptoms.  Water intake is also very important.  Below is a list of bladder irritants.  Drinks: alcohol, carbonated beverages, caffeinated beverages such as coffee and tea, citrus juices, apple juice, tomato juice  Foods: tomatoes and tomato based foods, spicy food, sugar and artificial sweeteners, vinegar, chocolate, raw onion, apples, citrus fruits, pineapple, cranberries, tomatoes, strawberries, plums, peaches, cantaloupe  Other: acidic urine (too concentrated) - see water intake info below  Substitutes you can try that are NOT irritating to the bladder: cooked onion, pears, papayas, sun-brewed decaf teas, watermelons, non-citrus herbal teas, apricots, kava and low-acid instant drinks (Postum).    WATER INTAKE: Remember to drink lots of water (aim for fluid intake of half your body weight with 2/3 of fluids being water).  You may be limiting fluids due to fear of leakage, but this can actually worsen urgency symptoms due to highly concentrated urine.  Water helps balance the pH of your urine so it doesn't become too acidic - acidic urine is a bladder irritant!

## 2019-07-12 NOTE — Progress Notes (Signed)
Patient Care Team: Kathyrn Lass, MD as PCP - General (Family Medicine)  DIAGNOSIS:    ICD-10-CM   1. Malignant neoplasm of upper-outer quadrant of left breast in female, estrogen receptor positive ([place])  C50.412    Z17.0     SUMMARY OF ONCOLOGIC HISTORY: Oncology History  Breast cancer of upper-outer quadrant of left female breast ([place])  03/08/2009 Initial Diagnosis   left breast 1.6 cm mass, MRI revealed 1.8 cm mass, biopsy showed IDC ER 99%, PR 51%, HER-2 negative, Ki-67 20%   04/25/2009 Surgery   1.7 cm grade 2 of 3 invasive ductal cancer. 0/4 LN Neg, Oncotype DX score 27, 18% risk of recurrence, T1c N0 stage IA   06/01/2009 - 08/03/2009 Chemotherapy   adjuvant chemotherapy with Taxotere and Cytoxan 4   07/09/2009 - 10/24/2009 Radiation Therapy   adjuvant radiation therapy   11/01/2009 - 11/05/2016 Anti-estrogen oral therapy   anastrozole 1 mg daily ( breast cancer index showed high risk of recurrence and high likelihood of benefit from extended adjuvant therapy)     CHIEF COMPLIANT: Follow-up of left breast cancer on surveillance  INTERVAL HISTORY: Holly Woodard is a 69 y.o. with above-mentioned history of left breast cancer treated with adjuvant chemotherapy, radiation, and who completed antiestrogen therapy with anastrozole in 2018. I last saw her 2.5 years ago. Screening mammogram on 05/17/19 showed a 0.5cm mass in the right breast. Diagnostic mammogram on 05/26/19 showed the mass to be a benign cyst, with no evidence of malignancy. She presents to the clinic today for follow-up.   ALLERGIES:  has No Known Allergies.  MEDICATIONS:  Current Outpatient Medications  Medication Sig Dispense Refill  . cholecalciferol (VITAMIN D) 1000 UNITS tablet Take 5,000 Units by mouth once a week.     . fexofenadine (ALLEGRA) 180 MG tablet Take 180 mg by mouth daily.     No current facility-administered medications for this visit.    PHYSICAL EXAMINATION: ECOG PERFORMANCE STATUS: 1 -  Symptomatic but completely ambulatory  Vitals:   07/13/19 1337  BP: 120/83  Pulse: 88  Resp: 17  Temp: 99.1 F (37.3 C)  SpO2: 99%   Filed Weights   07/13/19 1337  Weight: 211 lb 14.4 oz (96.1 kg)    BREAST: No palpable masses or nodules in either right or left breasts. No palpable axillary supraclavicular or infraclavicular adenopathy no breast tenderness or nipple discharge. (exam performed in the presence of a chaperone)  LABORATORY DATA:  I have reviewed the data as listed CMP Latest Ref Rng & Units 10/26/2014 10/13/2013 01/06/2013  Glucose 70 - 140 mg/dl 103 98 111  BUN 7.0 - 26.0 mg/dL 13.6 12.5 11.4  Creatinine 0.6 - 1.1 mg/dL 0.7 0.7 0.7  Sodium 136 - 145 mEq/L 136 140 141  Potassium 3.5 - 5.1 mEq/L 4.1 4.2 4.3  Chloride 96 - 112 mEq/L - - -  CO2 22 - 29 mEq/L _0 Calcium 8.4 - 10.4 mg/dL 9.2 9.6 9.2  Total Protein 6.4 - 8.3 g/dL 6.6 6.8 6.8  Total Bilirubin 0.20 - 1.20 mg/dL 0.55 0.66 0.71  Alkaline Phos 40 - 150 U/L 88 87 86  AST 5 - 34 U/L _1 ALT 0 - 55 U/L _2 Lab Results  Component Value Date   WBC 7.9 10/26/2014   HGB 14.2 10/26/2014   HCT 42.3 10/26/2014   MCV 91.0 10/26/2014   PLT 253 10/26/2014   NEUTROABS 5.5 10/26/2014  ASSESSMENT & PLAN:  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 Breast exam 07/13/2019 : Benign 2. Mammogram 05/26/2019 at [facility]  and ultrasound showed a 7 mm cyst in the right breast is felt to be benign.  Anastrozole toxicities: Denies any hot flashes or  myalgias. Breast cancer index revealed that she had a high risk of recurrence and high likelihood of benefit from extended adjuvant therapy.   Return to clinic on an as-needed basis    No orders of the defined types were placed in this encounter.  The patient has a good understanding of the overall plan. she agrees with it. she will call with any problems that may develop before the next visit here.  Total time spent: 20 mins including face to face time and time spent for planning, charting and coordination of care  Nicholas Lose, MD 07/13/2019  I, Cloyde Reams Dorshimer, am acting as scribe for Dr. Nicholas Lose.  I have reviewed the above documentation for accuracy and completeness, and I agree with the above.

## 2019-07-13 ENCOUNTER — Ambulatory Visit: Payer: PRIVATE HEALTH INSURANCE | Admitting: Physical Therapy

## 2019-07-13 ENCOUNTER — Inpatient Hospital Stay: Payer: PRIVATE HEALTH INSURANCE | Attending: Hematology and Oncology | Admitting: Hematology and Oncology

## 2019-07-13 ENCOUNTER — Encounter: Payer: Self-pay | Admitting: Physical Therapy

## 2019-07-13 ENCOUNTER — Other Ambulatory Visit: Payer: Self-pay

## 2019-07-13 DIAGNOSIS — Z170 Estrogen receptor positive status [ER+]: Secondary | ICD-10-CM | POA: Diagnosis not present

## 2019-07-13 DIAGNOSIS — C50412 Malignant neoplasm of upper-outer quadrant of left female breast: Secondary | ICD-10-CM

## 2019-07-13 DIAGNOSIS — M6281 Muscle weakness (generalized): Secondary | ICD-10-CM

## 2019-07-13 DIAGNOSIS — Z79811 Long term (current) use of aromatase inhibitors: Secondary | ICD-10-CM | POA: Insufficient documentation

## 2019-07-13 DIAGNOSIS — Z9221 Personal history of antineoplastic chemotherapy: Secondary | ICD-10-CM | POA: Insufficient documentation

## 2019-07-13 DIAGNOSIS — Z923 Personal history of irradiation: Secondary | ICD-10-CM | POA: Insufficient documentation

## 2019-07-13 DIAGNOSIS — R278 Other lack of coordination: Secondary | ICD-10-CM

## 2019-07-13 MED ORDER — ROSUVASTATIN CALCIUM 5 MG PO TABS: 5.0000 mg | ORAL_TABLET | ORAL | Status: AC

## 2019-07-13 NOTE — Patient Instructions (Signed)
Access Code: [email or web address] URL: [email or web address]: 04/14/2021Prepared by: Venetia Night BeuhringExercises  Sidelying Transversus Abdominis Bracing - 2 x daily - 7 x weekly - 1 sets - 10 reps - 10 hold  Supine Figure 4 Piriformis Stretch - 1 x daily - 7 x weekly - 1 sets - 3 reps - 30 hold  Supine Piriformis Stretch with Foot on Ground - 1 x daily - 7 x weekly - 1 sets - 3 reps - 30 hold  Hip Adductors and Hamstring Stretch with Strap - 1 x daily - 7 x weekly - 1 sets - 3 reps - 30 hold  Clamshell with Resistance - 1 x daily - 7 x weekly - 1 sets - 10 reps - 10 hold  Runner's Climb - 1 x daily - 7 x weekly - 2 sets - 15 reps  Seated Transversus Abdominis Bracing - 1 x daily - 7 x weekly - 1 sets - 10 reps - 10 hold

## 2019-07-13 NOTE — Therapy (Signed)
[facility] [address], [place], [place], [address] Phone: [phone]   Fax:  [phone]  Physical Therapy Treatment  Patient Details  Name: Holly Woodard MRN: [id number] Date of Birth: 1950/12/15 Referring Provider (PT): Kathyrn Lass, MD   Encounter Date: 07/13/2019  PT End of Session - 07/13/19 0753    Visit Number  3    Date for PT Re-Evaluation  08/10/19    Authorization Type  Medcost 30 visit limit    Authorization - Visit Number  3    Authorization - Number of Visits  30    PT Start Time  0753    PT Stop Time  0840    PT Time Calculation (min)  47 min    Activity Tolerance  Patient tolerated treatment well    Behavior During Therapy  [facility] for tasks assessed/performed       Past Medical History:  Diagnosis Date  . Arthritis    Degenerative disk   . Breast cancer [facility])     Past Surgical History:  Procedure Laterality Date  . BREAST LUMPECTOMY Left 04/25/2009    There were no vitals filed for this visit.  Subjective Assessment - 07/13/19 0754    Subjective  Pt continues to be able to avoid the full gush of urine but notes a tiny bubble of urine with the quick allergy cough that catches her by surprise.  I am usually at [facility] now that I'm stretching my void times more.    Pertinent History  2011 breast cancer    How long can you walk comfortably?  not regularly a walker for exercise but does occassionally, not limited.    Patient Stated Goals  reducing or eliminating leakage    Currently in Pain?  No/denies                       [facility] Adult PT Treatment/Exercise - 07/13/19 0001      Neuro Re-ed    Neuro Re-ed Details   seated on towel 5x10 sec TrA with breath control, add pelvic floor 5x10 to co-cue TrA and PF      Lumbar Exercises: Aerobic   Nustep  6' L2 PT present to review status      Lumbar Exercises: Seated   Other Seated Lumbar Exercises  on green ball,  pelvic tucks, circles, wags x 10 each      Knee/Hip Exercises: Stretches   Piriformis Stretch  Both;1 rep;30 seconds    Piriformis Stretch Limitations  supine    Other Knee/Hip Stretches  fig 4 push away 1x30 bil    Other Knee/Hip Stretches  hip adductor/hamstrings with strap supine      Knee/Hip Exercises: Standing   Forward Step Up  Both;1 set;15 reps;Step Height: 6";Hand Hold: 2             PT Education - 07/13/19 0840    Education Details  Access Code: [email or web address]    Person(s) Educated  Patient    Methods  Explanation;Demonstration;Verbal cues    Comprehension  Verbalized understanding;Returned demonstration       PT Short Term Goals - 07/13/19 0845      PT SHORT TERM GOAL #1   Title  Pt will be independent in initial HEP    Baseline  working on compliance    Status  On-going      PT SHORT TERM GOAL #2   Title  Pt will demo improved  recruitment of anterior pelvic floor muscles.    Status  Achieved      PT SHORT TERM GOAL #3   Title  Pt will report use of The Knack and Urge Drill with at least 20% reduced incidence of leakage with triggering activities.    Status  Achieved      PT SHORT TERM GOAL #4   Title  Pt will be educated on bladder irritants and fill out a bladder diary as needed to establish healthy bladder routine.    Status  On-going        PT Long Term Goals - 06/15/19 1113      PT LONG TERM GOAL #1   Title  Pt will be independent in advanced HEP targeting pelvic floor strength and coordination, core muscle strength, flexibility and dynamic functional strength.    Time  8    Period  Weeks    Status  New    Target Date  08/10/19      PT LONG TERM GOAL #2   Title  Pt will report at least 70% reduction in both stress and urge incontinence.    Time  8    Period  Weeks    Status  New    Target Date  08/10/19      PT LONG TERM GOAL #3   Title  Pt will achieve LE flexibility for bil hips to [facility] to improve dynamic mobility for daily activities.     Baseline  limited gluteals, piriformis    Time  8    Period  Weeks    Status  New      PT LONG TERM GOAL #4   Title  Pt will achieve at least 4+/5 strength for bil hips for improved dynamic tasks such as sit to stand, walking, stairs.    Baseline  hip flexion, extension, abduction 4/5 bil    Time  8    Period  Weeks    Status  New    Target Date  08/10/19      PT LONG TERM GOAL #5   Title  Pt will improved PERFECT score to at least 3/10/8//8 for improved endurance and strength of pelvic floor to better reduce or eliminate incontinence episodes.    Time  8    Period  Weeks    Status  New    Target Date  08/10/19            Plan - 07/13/19 0809    Clinical Impression Statement  Pt is working on stretching voiding times and hasn't had any big leaks.  She gets to [facility] with most voids demo'ing good filling and continence control.  She has minor leakage with quick coughs.  PT discussed need for increased compliance with PF contraction practice.  PT progressed hip strength with runners step ups (HEP) and progress core canister control in sitting with towel roll under perineal area for biofeedback.  Pt with increasing awareness of anterior PF contraction.  She was able to demo breath control and count aloud to 10 x 5 reps for TrA in isolation and with combo TrA and PF, noting accessory muscle use as she got fatigued.  She was able to generate more anterior PF quick flick recruitment with cue to "nod clitoris."  She will conitnue to benefit from skilled PT along POC.    Rehab Potential  Good    PT Frequency  1x / week    PT Duration  8 weeks  PT Treatment/Interventions  ADLs/Self Care Home Management;Biofeedback;Electrical Stimulation;Moist Heat;Functional mobility training;Therapeutic exercise;Balance training;Neuromuscular re-education;Manual techniques;Patient/family education;Passive range of motion;Dry needling;Joint Manipulations;Spinal Manipulations;Taping    PT Next Visit  Plan  review seated on towel TrA/PF contractions, progress to sit to stand, add seated ball squeeze and clam, biofeedback and internal recheck as needed    PT Home Exercise Plan  Access Code: [email or web address], urge and knack, bladder irritants, bladder log    Consulted and Agree with Plan of Care  Patient       Patient will benefit from skilled therapeutic intervention in order to improve the following deficits and impairments:     Visit Diagnosis: Muscle weakness (generalized)  Other lack of coordination     Problem List Patient Active Problem List   Diagnosis Date Noted  . Breast cancer of upper-outer quadrant of left female breast ([place]) 01/06/2013    Baruch Merl, PT 07/13/19 8:46 AM   [facility] [address], [place], [place], [address] Phone: [phone]   Fax:  [phone]  Name: MAUDY JENG MRN: [id number] Date of Birth: 07/08/1950

## 2019-07-13 NOTE — Assessment & Plan Note (Signed)
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 Breast exam 07/13/2019 : Benign 2. Mammogram 05/26/2019 at [facility]  and ultrasound showed a 7 mm cyst in the right breast is felt to be benign.  Anastrozole toxicities: Denies any hot flashes or myalgias. Breast cancer index revealed that she had a high risk of recurrence and high likelihood of benefit from extended adjuvant therapy.   Return to clinic in 1 year for long-term survivorship

## 2019-07-14 ENCOUNTER — Telehealth: Payer: Self-pay | Admitting: Hematology and Oncology

## 2019-07-14 NOTE — Telephone Encounter (Signed)
Scheduled per 04/14 los, patient has been called but voicemail was full. Calender will be mailed.

## 2019-07-20 ENCOUNTER — Encounter: Payer: Self-pay | Admitting: Physical Therapy

## 2019-07-20 ENCOUNTER — Other Ambulatory Visit: Payer: Self-pay

## 2019-07-20 ENCOUNTER — Ambulatory Visit: Payer: PRIVATE HEALTH INSURANCE | Admitting: Physical Therapy

## 2019-07-20 DIAGNOSIS — R278 Other lack of coordination: Secondary | ICD-10-CM

## 2019-07-20 DIAGNOSIS — M6281 Muscle weakness (generalized): Secondary | ICD-10-CM

## 2019-07-20 DIAGNOSIS — C50412 Malignant neoplasm of upper-outer quadrant of left female breast: Secondary | ICD-10-CM | POA: Diagnosis not present

## 2019-07-20 NOTE — Therapy (Signed)
[facility] [address] [place], [place], [address] Phone: [phone]   Fax:  [phone]  Physical Therapy Treatment  Patient Details  Name: NEEMA BARREIRA MRN: [id number] Date of Birth: 30-May-1950 Referring Provider (PT): Kathyrn Lass, MD   Encounter Date: 07/20/2019  PT End of Session - 07/20/19 0753    Visit Number  4    Date for PT Re-Evaluation  08/10/19    Authorization Type  Medcost 30 visit limit    Authorization - Visit Number  4    Authorization - Number of Visits  30    PT Start Time  9485    PT Stop Time  0835    PT Time Calculation (min)  40 min    Activity Tolerance  Patient tolerated treatment well    Behavior During Therapy  [facility] for tasks assessed/performed       Past Medical History:  Diagnosis Date  . Arthritis    Degenerative disk   . Breast cancer [facility])     Past Surgical History:  Procedure Laterality Date  . BREAST LUMPECTOMY Left 04/25/2009    There were no vitals filed for this visit.  Subjective Assessment - 07/20/19 0759    Subjective  I had a random (happens 1x/year) pain in lower central abdomen last week.  I got rid of it with a urine analgesic OTC med.  I am trying to do the HEP but sometimes I am just too busy/tired.    Pertinent History  2011 breast cancer    How long can you walk comfortably?  not regularly a walker for exercise but does occassionally, not limited.    Patient Stated Goals  reducing or eliminating leakage    Currently in Pain?  No/denies                       [facility] Adult PT Treatment/Exercise - 07/20/19 0001      Neuro Re-ed    Neuro Re-ed Details   biofeedback supine quick flicks 2x5, sidelying endurance low grade contractions 4 sec on 8-12 sec rest x 6 cycles, added ball squeeze as Pt fatigued, seated on towel PF contractions with TrA      Lumbar Exercises: Aerobic   Nustep  6' L3 PT present to review goals      Lumbar  Exercises: Standing   Shoulder Extension  Strengthening;Both;10 reps    Theraband Level (Shoulder Extension)  Level 1 (Yellow)    Shoulder Extension Limitations  TrA awareness, no PF awareness, 3 sec holds      Lumbar Exercises: Seated   Other Seated Lumbar Exercises  on green ball, pelvic tucks, circles, wags x 10 each      Knee/Hip Exercises: Standing   Hip Abduction  Stengthening;Both;1 set;10 reps;Knee straight    Hip Extension  Stengthening;Both;1 set;10 reps;Knee straight    Forward Step Up  Both;1 set;10 reps;Hand Hold: 2;Step Height: 6"               PT Short Term Goals - 07/20/19 0753      PT SHORT TERM GOAL #1   Title  Pt will be independent in initial HEP    Status  Achieved      PT SHORT TERM GOAL #2   Title  Pt will demo improved recruitment of anterior pelvic floor muscles.    Status  Achieved      PT SHORT TERM GOAL #3   Title  Pt will report use of The Knack and Urge Drill with at least 20% reduced incidence of leakage with triggering activities.    Status  Achieved      PT SHORT TERM GOAL #4   Title  Pt will be educated on bladder irritants and fill out a bladder diary as needed to establish healthy bladder routine.    Baseline  Pt feels she has regulated void schedule    Status  Achieved        PT Long Term Goals - 07/20/19 0757      PT LONG TERM GOAL #1   Title  Pt will be independent in advanced HEP targeting pelvic floor strength and coordination, core muscle strength, flexibility and dynamic functional strength.    Status  On-going      PT LONG TERM GOAL #2   Title  Pt will report at least 70% reduction in both stress and urge incontinence.    Status  On-going      PT LONG TERM GOAL #3   Title  Pt will achieve LE flexibility for bil hips to [facility] to improve dynamic mobility for daily activities.    Baseline  limited gluteals, piriformis    Status  On-going      PT LONG TERM GOAL #4   Title  Pt will achieve at least 4+/5 strength for bil  hips for improved dynamic tasks such as sit to stand, walking, stairs.    Baseline  hip flexion, extension, abduction 4/5 bil    Status  On-going      PT LONG TERM GOAL #5   Title  Pt will improved PERFECT score to at least 3/10/8//8 for improved endurance and strength of pelvic floor to better reduce or eliminate incontinence episodes.    Status  On-going            Plan - 07/20/19 5176    Clinical Impression Statement  Pt has met all STGs.  She admits only partial compliance with HEP due to busy schedule.  She has successfully transitioned to void schedule without compromise to continence.  She demo'd much improved strength with quick flicks today as seen on biofeedback.  She is quick to fatigue with repeated quick flicks.  Pt with good initiation of PF recruitment but with poor PF endurance.  PT encouraged Pt to focus on 1:3 work/rest ratio for low grade contractions 4 sec on: 12 sec off x 10 cycles followed by 10 quick flicks between now and next visit.  PT advanced standing counter hip strength today for ext/abd.  PT introduced standing TrA using yellow band shoulder ext bil.  Pt is more dominant with TrA vs PF when cued to co-recruit.  Pt will continue to benefit from skilled PT along POC to target success with LTGs.    Rehab Potential  Good    PT Frequency  1x / week    PT Duration  8 weeks    PT Treatment/Interventions  ADLs/Self Care Home Management;Biofeedback;Electrical Stimulation;Moist Heat;Functional mobility training;Therapeutic exercise;Balance training;Neuromuscular re-education;Manual techniques;Patient/family education;Passive range of motion;Dry needling;Joint Manipulations;Spinal Manipulations;Taping    PT Next Visit Plan  f/u on 4:12 sec work rest ratio with low grade contractions for endurance, biofeedback, continue pelvic ROM and hip strength, internal re-check on contractions next visit if Pt consents    PT Home Exercise Plan  Access Code: [email or web address]    Consulted and  Agree with Plan of Care  Patient       Patient will benefit from  skilled therapeutic intervention in order to improve the following deficits and impairments:     Visit Diagnosis: Muscle weakness (generalized)  Other lack of coordination     Problem List Patient Active Problem List   Diagnosis Date Noted  . Breast cancer of upper-outer quadrant of left female breast ([place]) 01/06/2013    Baruch Merl, PT 07/20/19 8:46 AM   [facility] [address], [place], [place], [address] Phone: [phone]   Fax:  [phone]  Name: SHALIYAH TAITE MRN: [id number] Date of Birth: 1951/01/03

## 2019-07-25 ENCOUNTER — Ambulatory Visit: Payer: PRIVATE HEALTH INSURANCE | Admitting: Physical Therapy

## 2019-08-03 ENCOUNTER — Ambulatory Visit: Payer: PRIVATE HEALTH INSURANCE | Attending: Family Medicine | Admitting: Physical Therapy

## 2019-08-03 ENCOUNTER — Encounter: Payer: Self-pay | Admitting: Physical Therapy

## 2019-08-03 ENCOUNTER — Other Ambulatory Visit: Payer: Self-pay

## 2019-08-03 DIAGNOSIS — R278 Other lack of coordination: Secondary | ICD-10-CM | POA: Insufficient documentation

## 2019-08-03 DIAGNOSIS — M6281 Muscle weakness (generalized): Secondary | ICD-10-CM | POA: Insufficient documentation

## 2019-08-03 NOTE — Therapy (Signed)
[facility] [address] [place], [place], [address] Phone: [phone]   Fax:  [phone]  Physical Therapy Treatment  Patient Details  Name: Holly Woodard MRN: [id number] Date of Birth: 02-04-1951 Referring Provider (PT): Kathyrn Lass, MD   Encounter Date: 08/03/2019  PT End of Session - 08/03/19 0838    Visit Number  5    Date for PT Re-Evaluation  08/10/19    Authorization Type  Medcost 30 visit limit    Authorization - Visit Number  5    Authorization - Number of Visits  30    PT Start Time  0750    PT Stop Time  0835    PT Time Calculation (min)  45 min    Activity Tolerance  Patient tolerated treatment well    Behavior During Therapy  [facility] for tasks assessed/performed       Past Medical History:  Diagnosis Date  . Arthritis    Degenerative disk   . Breast cancer [facility])     Past Surgical History:  Procedure Laterality Date  . BREAST LUMPECTOMY Left 04/25/2009    There were no vitals filed for this visit.  Subjective Assessment - 08/03/19 0755    Subjective  Less frequent "small pours" of leakage.  When it does happen I can't stop it.  It tends to happen when I'm full bladder and have been running errands and am trying to get to the bathroom.    Pertinent History  2011 breast cancer    How long can you walk comfortably?  not regularly a walker for exercise but does occassionally, not limited.    Patient Stated Goals  reducing or eliminating leakage    Currently in Pain?  No/denies                       [facility] Adult PT Treatment/Exercise - 08/03/19 0001      Self-Care   Self-Care  Other Self-Care Comments    Other Self-Care Comments   estim and biofeedback home units, handout given      Neuro Re-ed    Neuro Re-ed Details   SL TrA and PF low grade contraction 4 hold 8 rest x 10 cycles with PT TC at anal region through clothing, prone heel squeeze with TrA and PF 5x10 sec hold,  10 sec rest in between reps, PT cued low grade PF contraction for endurance focus      Exercises   Exercises  Lumbar;Knee/Hip      Lumbar Exercises: Stretches   Piriformis Stretch  Left;Right;1 rep;30 seconds    Figure 4 Stretch  1 rep;30 seconds;With overpressure;Supine    Other Lumbar Stretch Exercise  hip IR/ER x10 A/ROM supine      Lumbar Exercises: Aerobic   Nustep  7' L2 PT present to review goals      Lumbar Exercises: Standing   Other Standing Lumbar Exercises  counter plank 3x10 sec with TrA and PF cued by PT      Lumbar Exercises: Seated   Other Seated Lumbar Exercises  on green ball, pelvic tucks, circles, wags x 10 each      Manual Therapy   Manual Therapy  Myofascial release;Joint mobilization    Joint Mobilization  PAs T12-L2 GrII/III    Myofascial Release  thoracodorsal fascia bil, prone               PT Short Term Goals - 08/03/19 7564  PT SHORT TERM GOAL #1   Title  Pt will be independent in initial HEP    Status  Achieved      PT SHORT TERM GOAL #2   Title  Pt will demo improved recruitment of anterior pelvic floor muscles.    Status  Achieved      PT SHORT TERM GOAL #3   Title  Pt will report use of The Knack and Urge Drill with at least 20% reduced incidence of leakage with triggering activities.    Status  Achieved      PT SHORT TERM GOAL #4   Title  Pt will be educated on bladder irritants and fill out a bladder diary as needed to establish healthy bladder routine.    Status  Achieved        PT Long Term Goals - 08/03/19 0759      PT LONG TERM GOAL #1   Title  Pt will be independent in advanced HEP targeting pelvic floor strength and coordination, core muscle strength, flexibility and dynamic functional strength.    Status  On-going      PT LONG TERM GOAL #2   Title  Pt will report at least 70% reduction in both stress and urge incontinence.    Baseline  20%    Status  On-going      PT LONG TERM GOAL #3   Title  Pt will  achieve LE flexibility for bil hips to [facility] to improve dynamic mobility for daily activities.    Status  On-going      PT LONG TERM GOAL #4   Title  Pt will achieve at least 4+/5 strength for bil hips for improved dynamic tasks such as sit to stand, walking, stairs.    Baseline  hip flexion, extension, abduction 4/5 bil    Status  On-going      PT LONG TERM GOAL #5   Title  Pt will improved PERFECT score to at least 3/10/8//8 for improved endurance and strength of pelvic floor to better reduce or eliminate incontinence episodes.    Status  On-going            Plan - 08/03/19 6387    Clinical Impression Statement  Pt making progress toward all LTGs having met all STGs.  She is beginning to click with more isolated PF contraction vs with use of accessory muscle overflow.  She does benefit from SL ball squeeze.  She reports 20% reduction in leakage since starting PT and notes less frequent episodes of leakage.  Leakage is mostly with full bladder after running errands and trying to get to bathroom to void.  Less leakage at work.  PT gave estim and biofeedback unit info should she be interested in purchasing to compliment HEP.  PT added manual mobs to thoracolumbar region today.  Pt will need re-eval next visit and will likely taper to every other week visits to allow more time for HEP practice between visits.    PT Frequency  1x / week    PT Duration  8 weeks    PT Treatment/Interventions  ADLs/Self Care Home Management;Biofeedback;Electrical Stimulation;Moist Heat;Functional mobility training;Therapeutic exercise;Balance training;Neuromuscular re-education;Manual techniques;Patient/family education;Passive range of motion;Dry needling;Joint Manipulations;Spinal Manipulations;Taping    PT Next Visit Plan  re-eval, internal PF strength check, biofeedback, SL deep core isolation control    PT Home Exercise Plan  Access Code: [email or web address]    Consulted and Agree with Plan of Care  Patient  Patient will benefit from skilled therapeutic intervention in order to improve the following deficits and impairments:     Visit Diagnosis: Muscle weakness (generalized)  Other lack of coordination     Problem List Patient Active Problem List   Diagnosis Date Noted  . Breast cancer of upper-outer quadrant of left female breast ([place]) 01/06/2013    Baruch Merl, PT 08/03/19 8:43 AM   [facility] [address], [place], [place], [address] Phone: [phone]   Fax:  [phone]  Name: Holly Woodard MRN: [id number] Date of Birth: 05-21-1950

## 2019-08-10 ENCOUNTER — Other Ambulatory Visit: Payer: Self-pay

## 2019-08-10 ENCOUNTER — Encounter: Payer: Self-pay | Admitting: Physical Therapy

## 2019-08-10 ENCOUNTER — Ambulatory Visit: Payer: PRIVATE HEALTH INSURANCE | Admitting: Physical Therapy

## 2019-08-10 DIAGNOSIS — R278 Other lack of coordination: Secondary | ICD-10-CM

## 2019-08-10 DIAGNOSIS — M6281 Muscle weakness (generalized): Secondary | ICD-10-CM | POA: Diagnosis not present

## 2019-08-10 NOTE — Therapy (Signed)
[facility] [address], [place], [place], [address] Phone: [phone]   Fax:  [phone]  Physical Therapy Treatment  Patient Details  Name: Holly Woodard MRN: [id number] Date of Birth: Dec 12, 1950 Referring Provider (PT): Kathyrn Lass, MD   Encounter Date: 08/10/2019  PT End of Session - 08/10/19 0758    Visit Number  6    Date for PT Re-Evaluation  08/10/19    Authorization Type  [employer] 30 visit limit    Authorization - Visit Number  6    Authorization - Number of Visits  30    PT Start Time  3893    PT Stop Time  0840    PT Time Calculation (min)  46 min    Activity Tolerance  Patient tolerated treatment well    Behavior During Therapy  [facility] for tasks assessed/performed       Past Medical History:  Diagnosis Date  . Arthritis    Degenerative disk   . Breast cancer [facility])     Past Surgical History:  Procedure Laterality Date  . BREAST LUMPECTOMY Left 04/25/2009    There were no vitals filed for this visit.  Subjective Assessment - 08/10/19 0755    Subjective  Less leaking at work.  More this week when I was at home.  I notice more leakage on my walk to the bathroom on days I'm home. Going to try to reduce coffee intake at home to see if that helps.    Pertinent History  2011 breast cancer    How long can you walk comfortably?  not regularly a walker for exercise but does occassionally, not limited.    Patient Stated Goals  reducing or eliminating leakage    Currently in Pain?  No/denies                       [facility] Adult PT Treatment/Exercise - 08/10/19 0001      Neuro Re-ed    Neuro Re-ed Details   supine and SL TrA and PF contraction with visual cues vs physical to downtrain glut substitution      Exercises   Exercises  Lumbar;Knee/Hip      Lumbar Exercises: Stretches   Active Hamstring Stretch  Left;Right;1 rep;30 seconds   + adductors bil 1x30 each    Active Hamstring  Stretch Limitations  supine with strap      Lumbar Exercises: Aerobic   Nustep  L3 x 4' PT present for goal review      Lumbar Exercises: Standing   Other Standing Lumbar Exercises  rebounder 1' each side and stagger weight shift marching with core contraction      Lumbar Exercises: Supine   Clam  15 reps    Clam Limitations  blue, bil and Rt alternating    Bridge with clamshell  Compliant;10 reps   BLUE   Straight Leg Raise  10 reps    Straight Leg Raises Limitations  BIL, focus on stable pelvis             PT Education - 08/10/19 0825    Education Details  Access Code: [email or web address]    Person(s) Educated  Patient    Methods  Explanation;Verbal cues;Handout    Comprehension  Verbalized understanding;Returned demonstration       PT Short Term Goals - 08/03/19 0756      PT SHORT TERM GOAL #1   Title  Pt will be independent in initial  HEP    Status  Achieved      PT SHORT TERM GOAL #2   Title  Pt will demo improved recruitment of anterior pelvic floor muscles.    Status  Achieved      PT SHORT TERM GOAL #3   Title  Pt will report use of The Knack and Urge Drill with at least 20% reduced incidence of leakage with triggering activities.    Status  Achieved      PT SHORT TERM GOAL #4   Title  Pt will be educated on bladder irritants and fill out a bladder diary as needed to establish healthy bladder routine.    Status  Achieved        PT Long Term Goals - 08/10/19 0801      PT LONG TERM GOAL #1   Title  Pt will be independent in advanced HEP targeting pelvic floor strength and coordination, core muscle strength, flexibility and dynamic functional strength.    Baseline  building more complex exercise for HEP, working on isolating PF for improved anterior PF contraction    Status  On-going      PT LONG TERM GOAL #2   Title  Pt will report at least 70% reduction in both stress and urge incontinence.    Baseline  stress improved by 50%, urge ongoing with full bladder  on days at home, working on bladder irritant awareness (coffee trigger)    Status  On-going      PT LONG TERM GOAL #3   Title  Pt will achieve LE flexibility for bil hips to [facility] to improve dynamic mobility for daily activities.    Baseline  minor end range limitations present adductors, hamstrings    Status  On-going      PT LONG TERM GOAL #4   Title  Pt will achieve at least 4+/5 strength for bil hips for improved dynamic tasks such as sit to stand, walking, stairs.    Baseline  Rt hip ext, abd, ER 4/5, all other groups 5/5    Status  On-going      PT LONG TERM GOAL #5   Title  Pt will improved PERFECT score to at least 3/10/8//8 for improved endurance and strength of pelvic floor to better reduce or eliminate incontinence episodes.    Baseline  1/5 strength with poor anterior PF contraction    Status  On-going            Plan - 08/10/19 0843    Clinical Impression Statement  Pt has met all STGs and is making progress toward LTGs.  She continues to have weakness in Rt hip and PF anterior > posterior.  She is working on reducing bladder irritants on days she is at home as she thinks increased coffee consumption is contributing to leakage on way to toilet at home.  Improved stress incontinence by 50%.  Less leakage while at work.  PF strength is 1/5 with tendency to overuse gluts and abdominal wall.  Pt working in Pt on visualization and breath to improve recruitment.  PT progressed hip/core HEP today with good tolerance.  Pt will continue to benefit from skilled PT with visits every 1-2 weeks to allow for increased HEP practice.  Biofeedback next visit.    Personal Factors and Comorbidities  Time since onset of injury/illness/exacerbation    Examination-Activity Limitations  Continence    Examination-Participation Restrictions  Community Activity    Stability/Clinical Decision Making  Stable/Uncomplicated    Rehab  Potential  Good    PT Frequency  1x / week   every 1-2 weeks   PT  Duration  12 weeks    PT Treatment/Interventions  ADLs/Self Care Home Management;Biofeedback;Electrical Stimulation;Moist Heat;Functional mobility training;Therapeutic exercise;Balance training;Neuromuscular re-education;Manual techniques;Patient/family education;Passive range of motion;Dry needling;Joint Manipulations;Spinal Manipulations;Taping    PT Next Visit Plan  has reduced coffee helped urge/leakage, biofeedback, f/u on new HEP supine, exhale and visual for improved isolation of PF    PT Home Exercise Plan  Access Code: [email or web address]    Consulted and Agree with Plan of Care  Patient       Patient will benefit from skilled therapeutic intervention in order to improve the following deficits and impairments:  Decreased coordination, Decreased endurance, Postural dysfunction, Decreased strength, Impaired flexibility, Decreased balance  Visit Diagnosis: Muscle weakness (generalized) - Plan: PT plan of care cert/re-cert  Other lack of coordination - Plan: PT plan of care cert/re-cert     Problem List Patient Active Problem List   Diagnosis Date Noted  . Breast cancer of upper-outer quadrant of left female breast ([place]) 01/06/2013    Baruch Merl, PT 08/10/19 8:49 AM    [facility] [address] [place], [place], [address] Phone: [phone]   Fax:  [phone]  Name: NAKEMA FAKE MRN: [id number] Date of Birth: [birth year]

## 2019-08-10 NOTE — Patient Instructions (Signed)
Access Code: [email or web address] URL: [email or web address]: 04/21/2021Prepared by: Venetia Night BeuhringExercises  Sidelying Transversus Abdominis Bracing - 2 x daily - 7 x weekly - 1 sets - 10 reps - 10 hold  Supine Figure 4 Piriformis Stretch - 1 x daily - 7 x weekly - 1 sets - 3 reps - 30 hold  Supine Piriformis Stretch with Foot on Ground - 1 x daily - 7 x weekly - 1 sets - 3 reps - 30 hold  Hip Adductors and Hamstring Stretch with Strap - 1 x daily - 7 x weekly - 1 sets - 3 reps - 30 hold  Clamshell with Resistance - 1 x daily - 7 x weekly - 1 sets - 10 reps - 10 hold  Runner's Climb - 1 x daily - 7 x weekly - 2 sets - 15 reps  Seated Transversus Abdominis Bracing - 1 x daily - 7 x weekly - 1 sets - 10 reps - 10 hold  Standing Hip Extension with Counter Support - 1 x daily - 7 x weekly - 2 sets - 10 reps  Standing Hip Abduction with Counter Support - 1 x daily - 7 x weekly - 2 sets - 10 reps  Supine Hip Internal and External Rotation - 1 x daily - 7 x weekly - 10 reps - 3 sets  Active Straight Leg Raise with Quad Set - 1 x daily - 7 x weekly - 2 sets - 10 reps  Bridge with Hip Abduction and Resistance - Ground Touches - 1 x daily - 7 x weekly - 2 sets - 10 reps  Hooklying Clamshell with Resistance - 1 x daily - 7 x weekly - 2 sets - 10 reps

## 2019-08-17 ENCOUNTER — Encounter: Payer: PRIVATE HEALTH INSURANCE | Admitting: Physical Therapy

## 2019-08-24 ENCOUNTER — Encounter: Payer: PRIVATE HEALTH INSURANCE | Admitting: Physical Therapy

## 2019-08-31 ENCOUNTER — Ambulatory Visit: Payer: PRIVATE HEALTH INSURANCE | Admitting: Physical Therapy

## 2019-09-05 ENCOUNTER — Encounter: Payer: PRIVATE HEALTH INSURANCE | Admitting: Physical Therapy

## 2019-09-21 ENCOUNTER — Encounter: Payer: PRIVATE HEALTH INSURANCE | Admitting: Physical Therapy

## 2019-09-30 ENCOUNTER — Encounter: Payer: PRIVATE HEALTH INSURANCE | Admitting: Physical Therapy

## 2019-10-05 ENCOUNTER — Encounter: Payer: PRIVATE HEALTH INSURANCE | Admitting: Physical Therapy

## 2019-11-09 ENCOUNTER — Ambulatory Visit: Payer: PRIVATE HEALTH INSURANCE | Attending: Family Medicine | Admitting: Physical Therapy

## 2019-11-09 ENCOUNTER — Other Ambulatory Visit: Payer: Self-pay

## 2019-11-09 ENCOUNTER — Encounter: Payer: Self-pay | Admitting: Physical Therapy

## 2019-11-09 DIAGNOSIS — M6281 Muscle weakness (generalized): Secondary | ICD-10-CM | POA: Diagnosis present

## 2019-11-09 DIAGNOSIS — R278 Other lack of coordination: Secondary | ICD-10-CM | POA: Diagnosis present

## 2019-11-09 NOTE — Therapy (Signed)
[facility] [address], [place], [place], [address] Phone: [phone]   Fax:  [phone]  Physical Therapy Treatment  Patient Details  Name: Holly Woodard MRN: [id number] Date of Birth: 06-Aug-1950 Referring Provider (PT): Kathyrn Lass, MD   Encounter Date: 11/09/2019   PT End of Session - 11/09/19 0753    Visit Number 7    Date for PT Re-Evaluation 12/21/19    Authorization Type [employer] 30 visit limit    Authorization - Visit Number 7    Authorization - Number of Visits 30    PT Start Time 9381    PT Stop Time 0842    PT Time Calculation (min) 49 min    Activity Tolerance Patient tolerated treatment well    Behavior During Therapy [facility] for tasks assessed/performed           Past Medical History:  Diagnosis Date  . Arthritis    Degenerative disk   . Breast cancer [facility])     Past Surgical History:  Procedure Laterality Date  . BREAST LUMPECTOMY Left 04/25/2009    There were no vitals filed for this visit.   Subjective Assessment - 11/09/19 0754    Subjective Pt returns after a 1-2 month break from PT due to a broken Rt foot from a fall.  Has fallen 2x since she was last here.  I may be back to square one on my pelvic floor problems.  I have had a few days where urine pours but overall less frequency of leakage.  Usually when bladder is really full when running errands.    Pertinent History 2011 breast cancer    How long can you walk comfortably? not regularly a walker for exercise but does occassionally, not limited.    Patient Stated Goals reducing or eliminating leakage    Currently in Pain? No/denies              [facility] PT Assessment - 11/09/19 0001      Observation/Other Assessments   Observations Lt lateral lean over Lt hip in Lt stance phase      ROM / Strength   AROM / PROM / Strength PROM      PROM   Overall PROM Comments limited bil hip flexion and ER, end range      Strength     Overall Strength Comments Lt hip flexion 4-/5, all other LE [facility]      Flexibility   Soft Tissue Assessment /Muscle Length yes   adductors, hamstrings, piriformis limited 20% bil                     Pelvic Floor Special Questions - 11/09/19 0001    Urinary Leakage Yes    How often daily, varying amounts    Pad use daily    Activities that cause leaking With strong urge;Sneezing    Other activities that cause leaking less with stress incontinence    Urinary urgency Yes    Urinary frequency couple times a week    Fecal incontinence No    Caffeine beverages 2-3 cups of coffee    Perineal Body/Introitus  Descended    External Palpation non tender    Pelvic Floor Internal Exam PT explained assessment and obtained verbal consent before and throughout exam    Exam Type Vaginal    Strength weak squeeze, no lift    Strength # of seconds 5    Tone decreased  [place] Adult PT Treatment/Exercise - 11/09/19 0001      Self-Care   Other Self-Care Comments  timed fluid intake around errands      Neuro Re-ed    Neuro Re-ed Details  SL TrA with ball squeeze and PF 3x10, SL resisted clam with PF, seated in anterior pelvic tilt TrA with PF and ball squeeze   cueing to focus on imagery for PF with overlay of exercise     Exercises   Exercises Lumbar;Knee/Hip      Lumbar Exercises: Stretches   Active Hamstring Stretch Right;Left;1 rep;30 seconds    Active Hamstring Stretch Limitations supine with strap, + adductors with strap 1x30 each    Piriformis Stretch Left;Right;1 rep;30 seconds    Figure 4 Stretch 1 rep;30 seconds;With overpressure    Figure 4 Stretch Limitations supine    Other Lumbar Stretch Exercise bil windshield wiper hips x 10                    PT Short Term Goals - 08/03/19 0756      PT SHORT TERM GOAL #1   Title Pt will be independent in initial HEP    Status Achieved      PT SHORT TERM GOAL #2   Title Pt will demo improved  recruitment of anterior pelvic floor muscles.    Status Achieved      PT SHORT TERM GOAL #3   Title Pt will report use of The Knack and Urge Drill with at least 20% reduced incidence of leakage with triggering activities.    Status Achieved      PT SHORT TERM GOAL #4   Title Pt will be educated on bladder irritants and fill out a bladder diary as needed to establish healthy bladder routine.    Status Achieved             PT Long Term Goals - 11/09/19 0754      PT LONG TERM GOAL #1   Title Pt will be independent in advanced HEP targeting pelvic floor strength and coordination, core muscle strength, flexibility and dynamic functional strength.    Baseline intermittently compliant    Status On-going      PT LONG TERM GOAL #2   Title Pt will report at least 70% reduction in both stress and urge incontinence.    Baseline 50% improved, is reducing coffee intake    Status On-going      PT LONG TERM GOAL #3   Title Pt will achieve LE flexibility for bil hips to [facility] to improve dynamic mobility for daily activities.    Baseline minor end range limitations present adductors, hamstrings    Status On-going      PT LONG TERM GOAL #4   Title Pt will achieve at least 4+/5 strength for bil hips for improved dynamic tasks such as sit to stand, walking, stairs.      PT LONG TERM GOAL #5   Title Pt will improved PERFECT score to at least 3/10/8//8 for improved endurance and strength of pelvic floor to better reduce or eliminate incontinence episodes.    Baseline 1/5 strength with poor anterior PF contraction                 Plan - 11/09/19 1157    Clinical Impression Statement Pt missed appointments through last certification period due to a fall with foot fracture.  She has been somewhat compliant with PF HEP since she was last here.  She notes she is having ongoing leakage with full bladder or sneeze but with less frequency and less amount of leakage.  She continues to have some  mobility and strength deficits in bil LEs but this is improving.  Her core and PF strength is fair at best.  She has ongoing difficulty isolating and recruiting anterior PF.  PT used positional advantage of SL and sitting in anterior pelvic tilt using adductor overflow ball squeeze and imagery to improve contractions.  PT revised HEP to simplify for stretching and focused PF contractions with hip activation overflow.  Pt will benefit from re-vamping of PT for several more weeks with understanding that HEP compliance will maximize her success.    Personal Factors and Comorbidities Time since onset of injury/illness/exacerbation    Examination-Activity Limitations Continence    Examination-Participation Restrictions Community Activity    Stability/Clinical Decision Making Stable/Uncomplicated    Clinical Decision Making Low    Rehab Potential Good    PT Frequency 1x / week    PT Duration 6 weeks    PT Treatment/Interventions ADLs/Self Care Home Management;Biofeedback;Electrical Stimulation;Moist Heat;Functional mobility training;Therapeutic exercise;Balance training;Neuromuscular re-education;Manual techniques;Patient/family education;Passive range of motion;Dry needling;Joint Manipulations;Spinal Manipulations;Taping    PT Next Visit Plan review HEP SL and seated with ball squeeze, SL clam, try wall plank ski jump, anterior tilt on ball, prone?, sit to stand with ball squeeze    PT Home Exercise Plan Access Code: [email or web address]    Consulted and Agree with Plan of Care Patient           Patient will benefit from skilled therapeutic intervention in order to improve the following deficits and impairments:  Decreased coordination, Decreased endurance, Postural dysfunction, Decreased strength, Impaired flexibility, Decreased balance  Visit Diagnosis: Muscle weakness (generalized) - Plan: PT plan of care cert/re-cert  Other lack of coordination - Plan: PT plan of care cert/re-cert     Problem  List Patient Active Problem List   Diagnosis Date Noted  . Breast cancer of upper-outer quadrant of left female breast ([place]) 01/06/2013    Baruch Merl, PT 11/09/19 12:07 PM   [facility] [address] [place], [place], [address] Phone: [phone]   Fax:  [phone]  Name: TYESHIA CORNFORTH MRN: [id number] Date of Birth: 05-29-1950

## 2019-11-16 ENCOUNTER — Other Ambulatory Visit: Payer: Self-pay

## 2019-11-16 ENCOUNTER — Encounter: Payer: Self-pay | Admitting: Physical Therapy

## 2019-11-16 ENCOUNTER — Ambulatory Visit: Payer: PRIVATE HEALTH INSURANCE | Admitting: Physical Therapy

## 2019-11-16 DIAGNOSIS — R278 Other lack of coordination: Secondary | ICD-10-CM

## 2019-11-16 DIAGNOSIS — M6281 Muscle weakness (generalized): Secondary | ICD-10-CM | POA: Diagnosis not present

## 2019-11-16 NOTE — Patient Instructions (Signed)
Access Code: [email or web address] Prone Pelvic Floor Contraction on Swiss Ball - 1 x daily - 7 x weekly - 3 sets - 10 reps  Standing Plank on Wall - 1 x daily - 7 x weekly - 1 sets - 10 reps

## 2019-11-16 NOTE — Therapy (Signed)
[facility] [address] [place], [place], [address] Phone: [phone]   Fax:  [phone]  Physical Therapy Treatment  Patient Details  Name: Holly Woodard MRN: [id number] Date of Birth: April 07, 1950 Referring Provider (PT): Kathyrn Lass, MD   Encounter Date: 11/16/2019   PT End of Session - 11/16/19 0758    Visit Number 8    Date for PT Re-Evaluation 12/21/19    Authorization Type [employer] 30 visit limit    Authorization - Visit Number 8    Authorization - Number of Visits 30    PT Start Time 1062    PT Stop Time 0833    PT Time Calculation (min) 38 min    Activity Tolerance Patient tolerated treatment well    Behavior During Therapy [facility] for tasks assessed/performed           Past Medical History:  Diagnosis Date  . Arthritis    Degenerative disk   . Breast cancer [facility])     Past Surgical History:  Procedure Laterality Date  . BREAST LUMPECTOMY Left 04/25/2009    There were no vitals filed for this visit.   Subjective Assessment - 11/16/19 0757    Subjective I am doing HEP but don't have great awareness of my PF.    Pertinent History 2011 breast cancer    How long can you walk comfortably? not regularly a walker for exercise but does occassionally, not limited.    Patient Stated Goals reducing or eliminating leakage    Currently in Pain? No/denies                             [facility] Adult PT Treatment/Exercise - 11/16/19 0001      Neuro Re-ed    Neuro Re-ed Details  prone TrA grape hover with vaginal blueberry pick up along diagonal VCs/imagery cues      Exercises   Exercises Lumbar;Knee/Hip      Lumbar Exercises: Aerobic   UBE (Upper Arm Bike) L3 x 6' PT present to discuss HEP and progress      Lumbar Exercises: Seated   Other Seated Lumbar Exercises pelvic ROM on green ball: circles, wags, tucks/lifts 2x5 each, PT VCs for dissociative pelvic motion from trunk and LEs       Lumbar Exercises: Prone   Other Prone Lumbar Exercises over 2 pillows under pelvis: 10x3 sec holds TrA and PF      Knee/Hip Exercises: Sidelying   Hip ADduction Limitations ball squeeze with TrA 10x3 inhale on indrawing    Clams 10 reps bil with TrA                  PT Education - 11/16/19 0835    Education Details new positions for PF recruitment    Person(s) Educated Patient    Methods Explanation;Demonstration;Tactile cues;Verbal cues;Handout    Comprehension Verbalized understanding;Returned demonstration            PT Short Term Goals - 08/03/19 0756      PT SHORT TERM GOAL #1   Title Pt will be independent in initial HEP    Status Achieved      PT SHORT TERM GOAL #2   Title Pt will demo improved recruitment of anterior pelvic floor muscles.    Status Achieved      PT SHORT TERM GOAL #3   Title Pt will report use of The Knack and Urge Drill  with at least 20% reduced incidence of leakage with triggering activities.    Status Achieved      PT SHORT TERM GOAL #4   Title Pt will be educated on bladder irritants and fill out a bladder diary as needed to establish healthy bladder routine.    Status Achieved             PT Long Term Goals - 11/09/19 0754      PT LONG TERM GOAL #1   Title Pt will be independent in advanced HEP targeting pelvic floor strength and coordination, core muscle strength, flexibility and dynamic functional strength.    Baseline intermittently compliant    Status On-going      PT LONG TERM GOAL #2   Title Pt will report at least 70% reduction in both stress and urge incontinence.    Baseline 50% improved, is reducing coffee intake    Status On-going      PT LONG TERM GOAL #3   Title Pt will achieve LE flexibility for bil hips to [facility] to improve dynamic mobility for daily activities.    Baseline minor end range limitations present adductors, hamstrings    Status On-going      PT LONG TERM GOAL #4   Title Pt will achieve at  least 4+/5 strength for bil hips for improved dynamic tasks such as sit to stand, walking, stairs.      PT LONG TERM GOAL #5   Title Pt will improved PERFECT score to at least 3/10/8//8 for improved endurance and strength of pelvic floor to better reduce or eliminate incontinence episodes.    Baseline 1/5 strength with poor anterior PF contraction                 Plan - 11/16/19 0836    Clinical Impression Statement Pt has been compliant with HEP since last week with adherence to AM routine but not as much to consistent reps throughout day for PF strength.  She continues to demo improved TrA awareness and endurance but reports difficulty with anterior PF awareness.  PT introduced prone and wall plank ski jump position for gravity loading of bladder and anterior tissues onto anterior pelvic floor which gave Pt more of an "ah-ha" moment for feeling anterior PF effort.  PT provided ongoing visualization VCs and TCs along PF and lower abdomen to give feedback.  PT updated HEP for prone and wall plank positional training for PF.  PT educated Pt on fascial connections of TrA and PF and reiterated how TrA training can help build tension in fascia through PF to get support that way as well.  Continue working with positional advantage for PF and consider biofeedback as needed.    Examination-Activity Limitations Continence    PT Frequency 1x / week    PT Duration 6 weeks    PT Treatment/Interventions ADLs/Self Care Home Management;Biofeedback;Electrical Stimulation;Moist Heat;Functional mobility training;Therapeutic exercise;Balance training;Neuromuscular re-education;Manual techniques;Patient/family education;Passive range of motion;Dry needling;Joint Manipulations;Spinal Manipulations;Taping    PT Next Visit Plan f/u on wall plank ski jump and prone over pillows PF/TrA, try anterior tilt sitting on towel to progress into gravity, add seated yellow band pulldowns with exhale    PT Home Exercise Plan  Access Code: [email or web address]    Consulted and Agree with Plan of Care Patient           Patient will benefit from skilled therapeutic intervention in order to improve the following deficits and impairments:     Visit Diagnosis: Muscle  weakness (generalized)  Other lack of coordination     Problem List Patient Active Problem List   Diagnosis Date Noted  . Breast cancer of upper-outer quadrant of left female breast ([place]) 01/06/2013    Baruch Merl, PT 11/16/19 8:42 AM    [place] [facility] [address] [place], [place], [address] Phone: [phone]   Fax:  [phone]  Name: Holly Woodard MRN: [id number] Date of Birth: 11-09-1950

## 2019-11-23 ENCOUNTER — Ambulatory Visit: Payer: PRIVATE HEALTH INSURANCE | Admitting: Physical Therapy

## 2019-11-23 ENCOUNTER — Encounter: Payer: Self-pay | Admitting: Physical Therapy

## 2019-11-23 ENCOUNTER — Other Ambulatory Visit: Payer: Self-pay

## 2019-11-23 DIAGNOSIS — M6281 Muscle weakness (generalized): Secondary | ICD-10-CM | POA: Diagnosis not present

## 2019-11-23 DIAGNOSIS — R278 Other lack of coordination: Secondary | ICD-10-CM

## 2019-11-23 NOTE — Therapy (Signed)
[facility] [address] [place], [place], [address] Phone: [phone]   Fax:  [phone]  Physical Therapy Treatment  Patient Details  Name: Holly Woodard MRN: [id number] Date of Birth: 1950/09/21 Referring Provider (PT): Kathyrn Lass, MD   Encounter Date: 11/23/2019   PT End of Session - 11/23/19 0754    Visit Number 9    Date for PT Re-Evaluation 12/21/19    Authorization Type [employer] 30 visit limit    Authorization - Visit Number 9    Authorization - Number of Visits 30    PT Start Time 7654    PT Stop Time 0835    PT Time Calculation (min) 40 min    Activity Tolerance Patient tolerated treatment well    Behavior During Therapy [facility] for tasks assessed/performed           Past Medical History:  Diagnosis Date  . Arthritis    Degenerative disk   . Breast cancer [facility])     Past Surgical History:  Procedure Laterality Date  . BREAST LUMPECTOMY Left 04/25/2009    There were no vitals filed for this visit.   Subjective Assessment - 11/23/19 0757    Subjective No major leaks or acute leaks this week.  I am timing my fluid intake to avoid sipping all day and during errands which helps.  If I leak it is when I'm really full.    Pertinent History 2011 breast cancer    How long can you walk comfortably? not regularly a walker for exercise but does occassionally, not limited.    Patient Stated Goals reducing or eliminating leakage    Currently in Pain? No/denies                             [facility] Adult PT Treatment/Exercise - 11/23/19 0001      Self-Care   Self-Care Other Self-Care Comments    Other Self-Care Comments  delay void once sitting on toilet for urge control      Neuro Re-ed    Neuro Re-ed Details  prone over 2 pillows TrA and PF VCs 5x5 sec x 2 cycles, seated in anterior tilt PF contractions with sit to stand x 5 reps and PF contraction      Lumbar Exercises: Stretches    Piriformis Stretch Right;Left;20 seconds;1 rep    Figure 4 Stretch 1 rep;20 seconds;With overpressure    Figure 4 Stretch Limitations bil    Other Lumbar Stretch Exercise hip windshield wipers IR/ER x 10 supine    Other Lumbar Stretch Exercise butterfly stretch x 30 sec supine      Lumbar Exercises: Aerobic   Nustep L3 x 6', PT present to discuss HEP updates from last visit      Lumbar Exercises: Seated   Other Seated Lumbar Exercises shoulder extension seated on green ball yellow band x 15 reps, TCs TrA for awareness      Knee/Hip Exercises: Standing   Rebounder 1' each: stagger weight shifts and marching      Knee/Hip Exercises: Seated   Other Seated Knee/Hip Exercises 10 each: isolated pelvic girdle pelvic ROM: circles, wags, tilts                    PT Short Term Goals - 08/03/19 0756      PT SHORT TERM GOAL #1   Title Pt will be independent in initial HEP  Status Achieved      PT SHORT TERM GOAL #2   Title Pt will demo improved recruitment of anterior pelvic floor muscles.    Status Achieved      PT SHORT TERM GOAL #3   Title Pt will report use of The Knack and Urge Drill with at least 20% reduced incidence of leakage with triggering activities.    Status Achieved      PT SHORT TERM GOAL #4   Title Pt will be educated on bladder irritants and fill out a bladder diary as needed to establish healthy bladder routine.    Status Achieved             PT Long Term Goals - 11/09/19 0754      PT LONG TERM GOAL #1   Title Pt will be independent in advanced HEP targeting pelvic floor strength and coordination, core muscle strength, flexibility and dynamic functional strength.    Baseline intermittently compliant    Status On-going      PT LONG TERM GOAL #2   Title Pt will report at least 70% reduction in both stress and urge incontinence.    Baseline 50% improved, is reducing coffee intake    Status On-going      PT LONG TERM GOAL #3   Title Pt will  achieve LE flexibility for bil hips to [facility] to improve dynamic mobility for daily activities.    Baseline minor end range limitations present adductors, hamstrings    Status On-going      PT LONG TERM GOAL #4   Title Pt will achieve at least 4+/5 strength for bil hips for improved dynamic tasks such as sit to stand, walking, stairs.      PT LONG TERM GOAL #5   Title Pt will improved PERFECT score to at least 3/10/8//8 for improved endurance and strength of pelvic floor to better reduce or eliminate incontinence episodes.    Baseline 1/5 strength with poor anterior PF contraction                 Plan - 11/23/19 0801    Clinical Impression Statement Pt without any major leaks since last here.  She is working on timing her fluid intake which helps her avoid urge/leaking with errands.  Pt encouraged her to try delaying voiding once seated on toilet for further strength and urge control.  Pt has increased recruitment and awareness of TrA and PF in prone over pillows and even in seated with sit to stand using towel roll centrally and in anterior tilt sitting today.  This is a breakthrough for her.  She will continue to benefit from skilled PT along POC with ongoing focus of neuro re-ed with application of recruitment to functional movement and activity.    Rehab Potential Good    PT Frequency 1x / week    PT Duration 6 weeks    PT Treatment/Interventions ADLs/Self Care Home Management;Biofeedback;Electrical Stimulation;Moist Heat;Functional mobility training;Therapeutic exercise;Balance training;Neuromuscular re-education;Manual techniques;Patient/family education;Passive range of motion;Dry needling;Joint Manipulations;Spinal Manipulations;Taping    PT Next Visit Plan f/u on void delay once seated on toilet, review prone TrA and PF, seated on ball pelvic ROM and yellow band pullbacks, sit to stand in ant tilt over central towel roll    PT Home Exercise Plan Access Code: [email or web address]    Consulted  and Agree with Plan of Care Patient           Patient will benefit from skilled therapeutic intervention in order  to improve the following deficits and impairments:     Visit Diagnosis: Muscle weakness (generalized)  Other lack of coordination     Problem List Patient Active Problem List   Diagnosis Date Noted  . Breast cancer of upper-outer quadrant of left female breast ([place]) 01/06/2013    Baruch Merl, PT 11/23/19 8:41 AM   [facility] [address] [place], [place], [address] Phone: [phone]   Fax:  [phone]  Name: Holly Woodard MRN: [id number] Date of Birth: 1951/03/15

## 2019-11-30 ENCOUNTER — Encounter: Payer: Self-pay | Admitting: Physical Therapy

## 2019-11-30 ENCOUNTER — Ambulatory Visit: Payer: PRIVATE HEALTH INSURANCE | Attending: Family Medicine | Admitting: Physical Therapy

## 2019-11-30 ENCOUNTER — Other Ambulatory Visit: Payer: Self-pay

## 2019-11-30 DIAGNOSIS — M6281 Muscle weakness (generalized): Secondary | ICD-10-CM | POA: Diagnosis present

## 2019-11-30 DIAGNOSIS — R278 Other lack of coordination: Secondary | ICD-10-CM | POA: Diagnosis present

## 2019-11-30 NOTE — Therapy (Signed)
[facility] [address] [place], [place], [address] Phone: [phone]   Fax:  [phone]  Physical Therapy Treatment  Patient Details  Name: Holly Woodard MRN: [id number] Date of Birth: 10-25-1950 Referring Provider (PT): Kathyrn Lass, MD   Encounter Date: 11/30/2019   PT End of Session - 11/30/19 0759    Visit Number 10    Date for PT Re-Evaluation 12/21/19    Authorization Type [employer] 30 visit limit    Authorization - Visit Number 10    PT Start Time 3546    PT Stop Time 0833    PT Time Calculation (min) 38 min    Activity Tolerance Patient tolerated treatment well    Behavior During Therapy [facility] for tasks assessed/performed           Past Medical History:  Diagnosis Date   Arthritis    Degenerative disk    Breast cancer [facility])     Past Surgical History:  Procedure Laterality Date   BREAST LUMPECTOMY Left 04/25/2009    There were no vitals filed for this visit.   Subjective Assessment - 11/30/19 0758    Subjective I didn't have as good of a week for urine control as the week before.  Have been working on HEP but probably not as often as I should be.    Pertinent History 2011 breast cancer    How long can you walk comfortably? not regularly a walker for exercise but does occassionally, not limited.    Patient Stated Goals reducing or eliminating leakage    Currently in Pain? No/denies                             [facility] Adult PT Treatment/Exercise - 11/30/19 0001      Neuro Re-ed    Neuro Re-ed Details  prone over 2 pillows TrA and PF VCs 5x5 sec x 2 cycles, seated in anterior tilt PF contractions with sit to stand x 5 reps and PF contraction      Exercises   Exercises Lumbar;Knee/Hip;Shoulder      Lumbar Exercises: Aerobic   Nustep L3 x 6', PT present to discuss symptoms/progress      Lumbar Exercises: Seated   Other Seated Lumbar Exercises shoulder extension seated  on green ball yellow band x 15 reps, TCs TrA for awareness    Other Seated Lumbar Exercises pelvic ROM on ball: tilts, wags, circles x 10 each      Shoulder Exercises: Seated   Extension Strengthening;Both;Theraband;10 reps    Theraband Level (Shoulder Extension) Level 2 (Red)    Extension Limitations seated on green ball    Row Strengthening;Both;10 reps;Theraband    Theraband Level (Shoulder Row) Level 2 (Red)    Row Limitations seated on green ball    Flexion Strengthening;Both;10 reps;Weights    Flexion Weight (lbs) 2    Flexion Limitations seated on central towel roll and in anterior pelvic tilt                    PT Short Term Goals - 08/03/19 0756      PT SHORT TERM GOAL #1   Title Pt will be independent in initial HEP    Status Achieved      PT SHORT TERM GOAL #2   Title Pt will demo improved recruitment of anterior pelvic floor muscles.    Status Achieved  PT SHORT TERM GOAL #3   Title Pt will report use of The Knack and Urge Drill with at least 20% reduced incidence of leakage with triggering activities.    Status Achieved      PT SHORT TERM GOAL #4   Title Pt will be educated on bladder irritants and fill out a bladder diary as needed to establish healthy bladder routine.    Status Achieved             PT Long Term Goals - 11/09/19 0754      PT LONG TERM GOAL #1   Title Pt will be independent in advanced HEP targeting pelvic floor strength and coordination, core muscle strength, flexibility and dynamic functional strength.    Baseline intermittently compliant    Status On-going      PT LONG TERM GOAL #2   Title Pt will report at least 70% reduction in both stress and urge incontinence.    Baseline 50% improved, is reducing coffee intake    Status On-going      PT LONG TERM GOAL #3   Title Pt will achieve LE flexibility for bil hips to [facility] to improve dynamic mobility for daily activities.    Baseline minor end range limitations present  adductors, hamstrings    Status On-going      PT LONG TERM GOAL #4   Title Pt will achieve at least 4+/5 strength for bil hips for improved dynamic tasks such as sit to stand, walking, stairs.      PT LONG TERM GOAL #5   Title Pt will improved PERFECT score to at least 3/10/8//8 for improved endurance and strength of pelvic floor to better reduce or eliminate incontinence episodes.    Baseline 1/5 strength with poor anterior PF contraction                 Plan - 11/30/19 0814    Clinical Impression Statement Pt is able to contract PF and TrA collectively with stacked challenges through UE and LE ther ex today.  She is able to perform up to 3 reps of UE ther ex and sit to stand with PF endurance hold upon standing with much improved awareness and endurance.  PT explained how to put this focus into practice throughout the day for meaningful control during transfers and when walking to bathroom when full.  Pt continues to benefit from prone over pillows and PT VCs for TrA and PF and counting for endurance holds.  She gets fatigued in anterior PF after 5 reps x 5 sec holds.  She has difficulty with eccentric control of PF when PT cued elevator floors for graded return to resting.  Pt has last appointment next week and will be ready to continue HEP to further her deep core and PF strength for ongoing improvements in urinary control.    PT Frequency 1x / week    PT Duration 6 weeks    PT Treatment/Interventions ADLs/Self Care Home Management;Biofeedback;Electrical Stimulation;Moist Heat;Functional mobility training;Therapeutic exercise;Balance training;Neuromuscular re-education;Manual techniques;Patient/family education;Passive range of motion;Dry needling;Joint Manipulations;Spinal Manipulations;Taping    PT Next Visit Plan d/c next visit, f/u on void delay once seated on toilet, review prone TrA and PF eccentric focus, seated on ball pelvic ROM and red band pullbacks and row on ball, sit to  stand in ant tilt over central towel roll    PT Home Exercise Plan Access Code: [email or web address]    Consulted and Agree with Plan of Care Patient  Patient will benefit from skilled therapeutic intervention in order to improve the following deficits and impairments:     Visit Diagnosis: Muscle weakness (generalized)  Other lack of coordination     Problem List Patient Active Problem List   Diagnosis Date Noted   Breast cancer of upper-outer quadrant of left female breast ([place]) 01/06/2013    Baruch Merl, PT 11/30/19 8:35 AM   [facility] [address] [place], [place], [address] Phone: [phone]   Fax:  [phone]  Name: Holly Woodard MRN: [id number] Date of Birth: 05-Aug-1950

## 2019-12-07 ENCOUNTER — Other Ambulatory Visit: Payer: Self-pay

## 2019-12-07 ENCOUNTER — Encounter: Payer: Self-pay | Admitting: Physical Therapy

## 2019-12-07 ENCOUNTER — Ambulatory Visit: Payer: PRIVATE HEALTH INSURANCE | Admitting: Physical Therapy

## 2019-12-07 DIAGNOSIS — M6281 Muscle weakness (generalized): Secondary | ICD-10-CM | POA: Diagnosis not present

## 2019-12-07 DIAGNOSIS — R278 Other lack of coordination: Secondary | ICD-10-CM

## 2019-12-07 NOTE — Therapy (Signed)
[facility] [address] [place], [place], [address] Phone: [phone]   Fax:  [phone]  Physical Therapy Treatment  Patient Details  Name: Holly Woodard MRN: [id number] Date of Birth: 05-19-1950 Referring Provider (PT): Kathyrn Lass, MD   Encounter Date: 12/07/2019   PT End of Session - 12/07/19 0800    Visit Number 11    Date for PT Re-Evaluation 12/21/19    Authorization Type [employer] 30 visit limit    Authorization - Visit Number 11    Authorization - Number of Visits 30    PT Start Time 2505    PT Stop Time 0835    PT Time Calculation (min) 40 min    Activity Tolerance Patient tolerated treatment well    Behavior During Therapy [facility] for tasks assessed/performed           Past Medical History:  Diagnosis Date  . Arthritis    Degenerative disk   . Breast cancer [facility])     Past Surgical History:  Procedure Laterality Date  . BREAST LUMPECTOMY Left 04/25/2009    There were no vitals filed for this visit.   Subjective Assessment - 12/07/19 0757    Subjective Overall 50% improvement in leakage since starting PT.    Pertinent History 2011 breast cancer    How long can you walk comfortably? not regularly a walker for exercise but does occassionally, not limited.    Patient Stated Goals reducing or eliminating leakage                             [facility] Adult PT Treatment/Exercise - 12/07/19 0001      Neuro Re-ed    Neuro Re-ed Details  prone over 2 pillows TrA and PF VCs 10x5 sec x 2 cycles, seated in anterior tilt PF contractions with sit to stand x 5 reps and PF contractions: PT added ball squeeze and breath control      Lumbar Exercises: Aerobic   Nustep L3 x 6', PT present to review goals      Lumbar Exercises: Seated   Other Seated Lumbar Exercises shoulder ext and row (blue) seated on green ball, green band x 10 for TrA    Other Seated Lumbar Exercises pelvic ROM on  ball: tilts, wags, circles x 10 each      Knee/Hip Exercises: Standing   Forward Step Up Both;10 reps;Hand Hold: 2;Step Height: 6"    Forward Step Up Limitations VCs for glut squeeze    Other Standing Knee Exercises march toe taps x 20                    PT Short Term Goals - 12/07/19 0800      PT SHORT TERM GOAL #1   Title Pt will be independent in initial HEP    Status Achieved      PT SHORT TERM GOAL #2   Title Pt will demo improved recruitment of anterior pelvic floor muscles.    Status Achieved      PT SHORT TERM GOAL #3   Title Pt will report use of The Knack and Urge Drill with at least 20% reduced incidence of leakage with triggering activities.    Status Achieved      PT SHORT TERM GOAL #4   Title Pt will be educated on bladder irritants and fill out a bladder diary as needed to establish healthy bladder  routine.    Status Achieved             PT Long Term Goals - 12/07/19 0759      PT LONG TERM GOAL #1   Title Pt will be independent in advanced HEP targeting pelvic floor strength and coordination, core muscle strength, flexibility and dynamic functional strength.    Status Achieved      PT LONG TERM GOAL #2   Title Pt will report at least 70% reduction in both stress and urge incontinence.    Baseline 50% improved    Status Partially Met      PT LONG TERM GOAL #3   Title Pt will achieve LE flexibility for bil hips to [facility] to improve dynamic mobility for daily activities.    Status Achieved      PT LONG TERM GOAL #4   Title Pt will achieve at least 4+/5 strength for bil hips for improved dynamic tasks such as sit to stand, walking, stairs.    Status Achieved      PT LONG TERM GOAL #5   Title Pt will improved PERFECT score to at least 3/10/8//8 for improved endurance and strength of pelvic floor to better reduce or eliminate incontinence episodes.    Baseline 2/5 strength, gaining more awareness and needs positional assist    Status Partially Met                  Plan - 12/07/19 0801    Clinical Impression Statement Pt has met all STGs and met or partially met all LTGs. She reports 50% reduced leakage with PT.  She is moderately compliant with HEP and admits she could be doing more.  PF strength has improved to 2/5 from 1/5 with increasing awareness of anterior PF.  She continues to need positional assistance to load anterior PF seated in anterior tilt or prone over pillows for max awareness and success in training PF for now.  She was able to increase her prone reps of TrA and PF to 10x5 secs today demo'ing improving endurance.  She benefits from ball squeeze for sit to stands to improve PF recruitment.  PT educated her on how to progress layering functional strength over PF contraction for sit to stands, marching, stepping, step ups, and seated UE exercise.  She will continue to need compliance with avoidance of bladder irritants and commitment to HEP for further progress.  PT discussed a f/u with MD should she not continue to find progress beyond 50% she has achieved thus far with PT.    Personal Factors and Comorbidities Time since onset of injury/illness/exacerbation    Examination-Activity Limitations Continence    Rehab Potential Good    PT Frequency 1x / week    PT Duration 6 weeks    PT Treatment/Interventions ADLs/Self Care Home Management;Biofeedback;Electrical Stimulation;Moist Heat;Functional mobility training;Therapeutic exercise;Balance training;Neuromuscular re-education;Manual techniques;Patient/family education;Passive range of motion;Dry needling;Joint Manipulations;Spinal Manipulations;Taping    PT Next Visit Plan d/c to HEP with MD follow up if doesn't continue to improve    PT Home Exercise Plan Access Code: [email or web address]    Consulted and Agree with Plan of Care Patient           Patient will benefit from skilled therapeutic intervention in order to improve the following deficits and impairments:  Decreased  coordination, Decreased endurance, Postural dysfunction, Decreased strength, Impaired flexibility, Decreased balance  Visit Diagnosis: Muscle weakness (generalized)  Other lack of coordination     Problem List Patient Active  Problem List   Diagnosis Date Noted  . Breast cancer of upper-outer quadrant of left female breast ([place]) 01/06/2013    PHYSICAL THERAPY DISCHARGE SUMMARY Visits from Start of Care: 11  Current functional level related to goals / functional outcomes: SEE ABOVE   Remaining deficits: SEE ABOVE   Education / Equipment: HEP Plan: Patient agrees to discharge.  Patient goals were partially met. Patient is being discharged due to being pleased with the current functional level.  ?????         Baruch Merl, PT 12/07/19 8:38 AM   [facility] [address] [place], [place], [address] Phone: [phone]   Fax:  [phone]  Name: Holly Woodard MRN: [id number] Date of Birth: 10/18/1950

## 2020-07-05 ENCOUNTER — Encounter: Payer: Self-pay | Admitting: Family Medicine

## 2020-07-05 ENCOUNTER — Ambulatory Visit: Payer: PRIVATE HEALTH INSURANCE | Admitting: Family Medicine

## 2020-07-05 VITALS — BP 122/90 | HR 62 | Ht 65.0 in | Wt 212.1 lb

## 2020-07-05 DIAGNOSIS — Z789 Other specified health status: Secondary | ICD-10-CM

## 2020-07-05 NOTE — Progress Notes (Signed)
  Subjective:     Patient ID: Holly Woodard, female   DOB: 10-27-1950, 70 y.o.   MRN: [id number]  HPI  Holly Woodard presents to the [facility] health and wellness clinic today for her required wellness visit for her insurance. Her PCP is Dr. Kathyrn Lass, who she sees yearly. She reports mammogram was done earlier this year. She does not receive pap smears any longer. She thinks her colonoscopy is due next year, last one had a polyp and changed to q5years. She reports her BP has been borderline and she is keeping track of it at home, home BPs are typically 120s/90s. She denies any problems or concerns today.   Past Medical History:  Diagnosis Date  . Arthritis    Degenerative disk   . Breast cancer ([place])    No Known Allergies  Current Outpatient Medications:  .  cholecalciferol (VITAMIN D) 1000 UNITS tablet, Take 5,000 Units by mouth once a week. , Disp: , Rfl:  .  fexofenadine (ALLEGRA) 180 MG tablet, Take 180 mg by mouth daily., Disp: , Rfl:  .  rosuvastatin (CRESTOR) 5 MG tablet, Take 1 tablet (5 mg total) by mouth once a week., Disp: , Rfl:    Review of Systems  Constitutional: Negative for chills, fatigue, fever and unexpected weight change.  HENT: Negative for congestion, ear pain, sinus pressure, sinus pain and sore throat.   Eyes: Negative for discharge and visual disturbance.  Respiratory: Negative for cough, shortness of breath and wheezing.   Cardiovascular: Negative for chest pain and leg swelling.  Gastrointestinal: Negative for abdominal pain, blood in stool, constipation, diarrhea, nausea and vomiting.  Genitourinary: Negative for difficulty urinating and hematuria.  Skin: Negative for color change.  Neurological: Negative for dizziness, weakness, light-headedness and headaches.  Hematological: Negative for adenopathy.  All other systems reviewed and are negative.      Objective:   Physical Exam Vitals reviewed.  Constitutional:      General: She is not in acute  distress.    Appearance: Normal appearance. She is well-developed.  HENT:     Head: Normocephalic and atraumatic.  Eyes:     General:        Right eye: No discharge.        Left eye: No discharge.  Cardiovascular:     Rate and Rhythm: Normal rate and regular rhythm.     Heart sounds: Normal heart sounds.  Pulmonary:     Effort: Pulmonary effort is normal. No respiratory distress.     Breath sounds: Normal breath sounds.  Musculoskeletal:     Cervical back: Neck supple.  Skin:    General: Skin is warm and dry.  Neurological:     Mental Status: She is alert and oriented to person, place, and time.  Psychiatric:        Mood and Affect: Mood normal.        Behavior: Behavior normal.    Today's Vitals   07/05/20 1126  BP: 122/90  Pulse: 62  SpO2: 96%  Weight: 212 lb 1.6 oz (96.2 kg)  Height: 5\' 5"  (1.651 m)   Body mass index is 35.3 kg/m.     Assessment:     Participant in health and wellness plan      Plan:     1. Keep all f/u appt with PCP. Encouraged continued efforts at healthy eating habits and increased physical activity. F/u here prn.

## 2020-07-09 ENCOUNTER — Telehealth: Payer: Self-pay | Admitting: Hematology and Oncology

## 2020-07-09 NOTE — Telephone Encounter (Signed)
Rescheduled per 4/8 sch msg. Called and spoke with pt, confirmed 5/3 appt

## 2020-07-12 ENCOUNTER — Inpatient Hospital Stay: Payer: PRIVATE HEALTH INSURANCE | Admitting: Hematology and Oncology

## 2020-07-30 NOTE — Progress Notes (Incomplete)
Patient Care Team: Kathyrn Lass, MD as PCP - General (Family Medicine)  DIAGNOSIS: No diagnosis found.  SUMMARY OF ONCOLOGIC HISTORY: Oncology History  Breast cancer of upper-outer quadrant of left female breast ([place])  03/08/2009 Initial Diagnosis   left breast 1.6 cm mass, MRI revealed 1.8 cm mass, biopsy showed IDC ER 99%, PR 51%, HER-2 negative, Ki-67 20%   04/25/2009 Surgery   1.7 cm grade 2 of 3 invasive ductal cancer. 0/4 LN Neg, Oncotype DX score 27, 18% risk of recurrence, T1c N0 stage IA   06/01/2009 - 08/03/2009 Chemotherapy   adjuvant chemotherapy with Taxotere and Cytoxan 4   07/09/2009 - 10/24/2009 Radiation Therapy   adjuvant radiation therapy   11/01/2009 - 11/05/2016 Anti-estrogen oral therapy   anastrozole 1 mg daily ( breast cancer index showed high risk of recurrence and high likelihood of benefit from extended adjuvant therapy)     CHIEF COMPLIANT: Follow-up of left breast cancer on surveillance  INTERVAL HISTORY: Holly Woodard is a 70 y.o. with above-mentioned history of left breast cancer treated with adjuvant chemotherapy, radiation, and who completed antiestrogen therapy with anastrozole in 2018. She presents to the clinic today for follow-up.   ALLERGIES:  has No Known Allergies.  MEDICATIONS:  Current Outpatient Medications  Medication Sig Dispense Refill  . cholecalciferol (VITAMIN D) 1000 UNITS tablet Take 5,000 Units by mouth once a week.     . fexofenadine (ALLEGRA) 180 MG tablet Take 180 mg by mouth daily.    . rosuvastatin (CRESTOR) 5 MG tablet Take 1 tablet (5 mg total) by mouth once a week.     No current facility-administered medications for this visit.    PHYSICAL EXAMINATION: ECOG PERFORMANCE STATUS: {CHL ONC ECOG PS:[id number]}  There were no vitals filed for this visit. There were no vitals filed for this visit.  BREAST:*** No palpable masses or nodules in either right or left breasts. No palpable axillary supraclavicular or  infraclavicular adenopathy no breast tenderness or nipple discharge. (exam performed in the presence of a chaperone)  LABORATORY DATA:  I have reviewed the data as listed CMP Latest Ref Rng & Units 10/26/2014 10/13/2013 01/06/2013  Glucose 70 - 140 mg/dl 103 98 111  BUN 7.0 - 26.0 mg/dL 13.6 12.5 11.4  Creatinine 0.6 - 1.1 mg/dL 0.7 0.7 0.7  Sodium 136 - 145 mEq/L 136 140 141  Potassium 3.5 - 5.1 mEq/L 4.1 4.2 4.3  Chloride 96 - 112 mEq/L - - -  CO2 22 - 29 mEq/L 23 23 22   Calcium 8.4 - 10.4 mg/dL 9.2 9.6 9.2  Total Protein 6.4 - 8.3 g/dL 6.6 6.8 6.8  Total Bilirubin 0.20 - 1.20 mg/dL 0.55 0.66 0.71  Alkaline Phos 40 - 150 U/L 88 87 86  AST 5 - 34 U/L 24 22 17   ALT 0 - 55 U/L 28 24 20     Lab Results  Component Value Date   WBC 7.9 10/26/2014   HGB 14.2 10/26/2014   HCT 42.3 10/26/2014   MCV 91.0 10/26/2014   PLT 253 10/26/2014   NEUTROABS 5.5 10/26/2014    ASSESSMENT & PLAN:  No problem-specific Assessment & Plan notes found for this encounter.    No orders of the defined types were placed in this encounter.  The patient has a good understanding of the overall plan. she agrees with it. she will call with any problems that may develop before the next visit here.  Total time spent: *** mins including face to face time  and time spent for planning, charting and coordination of care  Rulon Eisenmenger, MD, MPH 07/30/2020  I, Molly Dorshimer, am acting as scribe for Dr. Nicholas Lose.  {insert scribe attestation}

## 2020-07-30 NOTE — Assessment & Plan Note (Deleted)
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 Breast exam 07/13/2019: Benign 2. Mammogram 05/26/2019 at [facility] and ultrasound showed a 7 mm cyst in the right breast is felt to be benign.  Anastrozole toxicities: Denies any hot flashes or myalgias. Breast cancer index revealed that she had a high risk of recurrence and high likelihood of benefit from extended adjuvant therapy.

## 2020-07-31 ENCOUNTER — Telehealth: Payer: Self-pay | Admitting: Hematology and Oncology

## 2020-07-31 ENCOUNTER — Inpatient Hospital Stay: Payer: PRIVATE HEALTH INSURANCE | Admitting: Hematology and Oncology

## 2020-07-31 DIAGNOSIS — C50412 Malignant neoplasm of upper-outer quadrant of left female breast: Secondary | ICD-10-CM

## 2020-07-31 NOTE — Telephone Encounter (Signed)
Cancelled appt per 5/3 sch msg. Called pt, no answer. Left msg for pt to call back if wanting to r/s.

## 2020-08-24 ENCOUNTER — Telehealth: Payer: Self-pay | Admitting: Hematology and Oncology

## 2020-08-24 NOTE — Telephone Encounter (Signed)
Scheduled appointment per 05/27 sch msg. Patient is aware.

## 2020-09-04 ENCOUNTER — Ambulatory Visit: Payer: PRIVATE HEALTH INSURANCE | Admitting: Hematology and Oncology

## 2021-05-28 ENCOUNTER — Encounter: Payer: Self-pay | Admitting: Hematology and Oncology

## 2021-05-30 ENCOUNTER — Other Ambulatory Visit: Payer: Self-pay

## 2021-05-30 ENCOUNTER — Ambulatory Visit: Payer: Self-pay | Admitting: Nurse Practitioner

## 2021-05-30 ENCOUNTER — Encounter: Payer: Self-pay | Admitting: Nurse Practitioner

## 2021-05-30 VITALS — BP 128/74 | HR 63 | Temp 95.2°F | Resp 16 | Ht 66.0 in | Wt 215.0 lb

## 2021-05-30 DIAGNOSIS — Z789 Other specified health status: Secondary | ICD-10-CM

## 2021-05-30 DIAGNOSIS — R739 Hyperglycemia, unspecified: Secondary | ICD-10-CM | POA: Insufficient documentation

## 2021-05-30 DIAGNOSIS — E559 Vitamin D deficiency, unspecified: Secondary | ICD-10-CM | POA: Insufficient documentation

## 2021-05-30 DIAGNOSIS — E785 Hyperlipidemia, unspecified: Secondary | ICD-10-CM | POA: Insufficient documentation

## 2021-05-30 DIAGNOSIS — M79672 Pain in left foot: Secondary | ICD-10-CM

## 2021-05-30 DIAGNOSIS — R030 Elevated blood-pressure reading, without diagnosis of hypertension: Secondary | ICD-10-CM | POA: Insufficient documentation

## 2021-05-30 DIAGNOSIS — Z853 Personal history of malignant neoplasm of breast: Secondary | ICD-10-CM | POA: Insufficient documentation

## 2021-05-30 DIAGNOSIS — M8580 Other specified disorders of bone density and structure, unspecified site: Secondary | ICD-10-CM | POA: Insufficient documentation

## 2021-05-30 LAB — LIPID PANEL
Cholesterol: 182 mg/dL (ref <200)
HDL: 59 mg/dL (ref >=50)
LDL Cholesterol (Calc): 103 mg/dL (calc) — ABNORMAL HIGH
Non-HDL Cholesterol (Calc): 123 mg/dL (calc) (ref <130)
Total CHOL/HDL Ratio: 3.1 (calc) (ref <5.0)
Triglycerides: 104 mg/dL (ref <150)

## 2021-05-30 NOTE — Progress Notes (Signed)
? ?Established Patient Office Visit ? ?Subjective:  ?Patient ID: Holly Woodard, female    DOB: 1950-11-25  Age: 71 y.o. MRN: [id number] ? ?CC: Annual Wellness Exam  ? ? ?HPI ?Holly Woodard presents for a wellness check.  ? ?2 months of left heel pain. No changes in shoes. Pain is felt when she is stepping on it. Not ache or a stabbing, unable to describe the pain. Causes her to limp. Bought naprosyn Tuesday night and has relieved the pain when she takes it. Denies bruising, redness or swelling at the site. Denies trauma to the heel.  ? ?She does not check her BP at home, has had previous elevated readings in appointments, but never put on medication.  ? ?She has also had elevated glucose readings in the past, but never diagnosed with DM.  ? ?Denies fevers, chills. Denies chest pain, shortness of breath. Denies new breast lumps.  ? ?Dentist: Dr. Christie Nottingham Apr 24, 2021 ?Eye MD: [facility] Dr. Monica Martinez 03/13/2021 ? ?Past Medical History:  ?Diagnosis Date  ? Arthritis   ? Degenerative disk   ? Breast cancer ([place])   ? ? ?Past Surgical History:  ?Procedure Laterality Date  ? BREAST LUMPECTOMY Left 04/25/2009  ? ? ?Family History  ?Problem Relation Age of Onset  ? Diabetes Mother   ? Dementia Mother   ? Heart attack Mother   ? Cancer Father   ?     Lung CA  ? Hypertension Father   ? Hyperlipidemia Father   ? Cancer Sister 74  ?     DCIS  ? ? ?Social History  ? ?Socioeconomic History  ? Marital status: Divorced  ?  Spouse name: Not on file  ? Number of children: Not on file  ? Years of education: Not on file  ? Highest education level: Not on file  ?Occupational History  ? Not on file  ?Tobacco Use  ? Smoking status: Never  ? Smokeless tobacco: Never  ?Substance and Sexual Activity  ? Alcohol use: Yes  ?  Comment: Rarely/Social  ? Drug use: No  ? Sexual activity: Never  ?  Birth control/protection: Post-menopausal  ?Other Topics Concern  ? Not on file  ?Social History Narrative  ? Not on file  ? ?Social Determinants of  Health  ? ?Financial Resource Strain: Not on file  ?Food Insecurity: Not on file  ?Transportation Needs: Not on file  ?Physical Activity: Not on file  ?Stress: Not on file  ?Social Connections: Not on file  ?Intimate Partner Violence: Not on file  ? ? ?Outpatient Medications Prior to Visit  ?Medication Sig Dispense Refill  ? cholecalciferol (VITAMIN D) 1000 UNITS tablet Take 5,000 Units by mouth once a week.     ? fexofenadine (ALLEGRA) 180 MG tablet Take 180 mg by mouth daily.    ? rosuvastatin (CRESTOR) 5 MG tablet Take 1 tablet (5 mg total) by mouth once a week.    ? ?No facility-administered medications prior to visit.  ? ? ?Allergies  ?Allergen Reactions  ? Atorvastatin Other (See Comments)  ? ? ?ROS ?Review of Systems ? ? See HPI  ?Objective:  ?  ?Physical Exam ?Constitutional:   ?   Appearance: Normal appearance. She is not ill-appearing.  ?HENT:  ?   Head: Normocephalic and atraumatic.  ?   Right Ear: Tympanic membrane normal.  ?   Left Ear: Tympanic membrane normal.  ?   Nose: Nose normal.  ?   Mouth/Throat:  ?  Mouth: Mucous membranes are moist.  ?   Pharynx: Oropharynx is clear.  ?Eyes:  ?   Conjunctiva/sclera: Conjunctivae normal.  ?   Pupils: Pupils are equal, round, and reactive to light.  ?Cardiovascular:  ?   Rate and Rhythm: Normal rate and regular rhythm.  ?   Pulses: Normal pulses.     ?     Dorsalis pedis pulses are 2+ on the right side and 2+ on the left side.  ?     Posterior tibial pulses are 2+ on the right side and 2+ on the left side.  ?   Heart sounds: Normal heart sounds.  ?Pulmonary:  ?   Effort: Pulmonary effort is normal.  ?   Breath sounds: Normal breath sounds.  ?Abdominal:  ?   General: Bowel sounds are normal.  ?   Palpations: Abdomen is soft.  ?Musculoskeletal:     ?   General: Tenderness present. No swelling, deformity or signs of injury. Normal range of motion.  ?   Cervical back: Normal range of motion and neck supple.  ?   Right lower leg: No edema.  ?   Left lower leg: No  edema.  ?   Right foot: Normal range of motion. No deformity or foot drop.  ?   Left foot: Normal range of motion. No deformity or foot drop.  ?     Feet: ? ?   Comments: Tenderness on left heel.   ?Feet:  ?   Right foot:  ?   Skin integrity: Dry skin present.  ?   Left foot:  ?   Skin integrity: Dry skin present.  ?   Comments: Area of tenderness upon palpation marked ?Lymphadenopathy:  ?   Cervical: No cervical adenopathy.  ?Skin: ?   General: Skin is warm and dry.  ?   Capillary Refill: Capillary refill takes less than 2 seconds.  ?Neurological:  ?   General: No focal deficit present.  ?   Mental Status: She is alert and oriented to person, place, and time. Mental status is at baseline.  ?Psychiatric:     ?   Mood and Affect: Mood normal.     ?   Behavior: Behavior normal.     ?   Thought Content: Thought content normal.  ? ? ?BP 128/74 (BP Location: Right Arm, Patient Position: Sitting, Cuff Size: Normal)   Pulse 63   Temp (!) 95.2 ?F (35.1 ?C) (Temporal)   Resp 16   Ht 5' 6"  (1.676 m)   Wt 215 lb (97.5 kg)   SpO2 98%   BMI 34.70 kg/m?  ?Wt Readings from Last 3 Encounters:  ?05/30/21 215 lb (97.5 kg)  ?07/05/20 212 lb 1.6 oz (96.2 kg)  ?07/13/19 211 lb 14.4 oz (96.1 kg)  ? ? ? ?Health Maintenance Due  ?Topic Date Due  ? Hepatitis C Screening  Never done  ? Pneumonia Vaccine 5+ Years old (1 - PCV) 01/22/2016  ? INFLUENZA VACCINE  10/29/2020  ? COVID-19 Vaccine (4 - Booster for Moderna series) 10/31/2020  ? ? ?There are no preventive care reminders to display for this patient. ? ?No results found for: TSH ?Lab Results  ?Component Value Date  ? WBC 7.9 10/26/2014  ? HGB 14.2 10/26/2014  ? HCT 42.3 10/26/2014  ? MCV 91.0 10/26/2014  ? PLT 253 10/26/2014  ? ?Lab Results  ?Component Value Date  ? NA 136 10/26/2014  ? K 4.1 10/26/2014  ? CHLORIDE 106 10/26/2014  ?  CO2 23 10/26/2014  ? GLUCOSE 103 10/26/2014  ? BUN 13.6 10/26/2014  ? CREATININE 0.7 10/26/2014  ? BILITOT 0.55 10/26/2014  ? ALKPHOS 88 10/26/2014   ? AST 24 10/26/2014  ? ALT 28 10/26/2014  ? PROT 6.6 10/26/2014  ? ALBUMIN 3.9 10/26/2014  ? CALCIUM 9.2 10/26/2014  ? ANIONGAP 8 10/26/2014  ? EGFR >90 10/26/2014  ? ?Lab Results  ?Component Value Date  ? CHOL 211 (H) 02/17/2019  ? ?Lab Results  ?Component Value Date  ? HDL 58 02/17/2019  ? ?Lab Results  ?Component Value Date  ? LDLCALC 131 (H) 02/17/2019  ? ?Lab Results  ?Component Value Date  ? TRIG 112 02/17/2019  ? ?Lab Results  ?Component Value Date  ? CHOLHDL 3.6 02/17/2019  ? ?Lab Results  ?Component Value Date  ? HGBA1C 5.5 02/17/2019  ? ? ?  ?Assessment & Plan:  ? ?Problem List Items Addressed This Visit   ? ?  ? Other  ? Hyperlipidemia  ? Relevant Orders  ? Lipid Panel ? ?Ongoing. Stable.  ?Will follow up with results of lipid panel.  ?Continue regimen  ? ?Other Visit Diagnoses   ? ? Participant in health and wellness plan    -  Primary  ? Relevant Orders  ? Hepatitis C Antibody  ? COMPLETE METABOLIC PANEL WITH GFR  ? CBC with Differential  ? Hemoglobin A1c  ? TSH ? ?Stable.  ?Will follow up with lab results.  ?Continue to follow up with PCP, Dentist, and Ophthalmologist annually, or as needed. ?Encouraged to implement healthy lifestyle habits with a low carb, low sodium, well balanced diet. Increase physical activity.  ?  ? Pain of left heel     ? ?Ongoing. Stable. ?More likely acute inflammatory process vs. Infection or trauma etiology ?Continue Naproxen, dosed per label, topical ice/heat therapy, and elevating legs at the end of the day.  ?Follow up with PCP if problem continues or worsens.   ? ?RTC as needed.  ? ?No orders of the defined types were placed in this encounter. ? ? ?Follow-up: No follow-ups on file.  ? ? ?Drucilla Chalet, NP ?

## 2021-05-31 LAB — COMPLETE METABOLIC PANEL WITH GFR
AG Ratio: 2.2 (calc) (ref 1.0–2.5)
ALT: 18 U/L (ref 6–29)
AST: 19 U/L (ref 10–35)
Albumin: 4.3 g/dL (ref 3.6–5.1)
Alkaline phosphatase (APISO): 70 U/L (ref 37–153)
BUN: 11 mg/dL (ref 7–25)
CO2: 20 mmol/L (ref 20–32)
Calcium: 9.2 mg/dL (ref 8.6–10.4)
Chloride: 107 mmol/L (ref 98–110)
Creat: 0.67 mg/dL (ref 0.60–1.00)
Globulin: 2 g/dL (calc) (ref 1.9–3.7)
Glucose, Bld: 109 mg/dL — ABNORMAL HIGH (ref 65–99)
Potassium: 4.2 mmol/L (ref 3.5–5.3)
Sodium: 141 mmol/L (ref 135–146)
Total Bilirubin: 0.8 mg/dL (ref 0.2–1.2)
Total Protein: 6.3 g/dL (ref 6.1–8.1)
eGFR: 94 mL/min/{1.73_m2} (ref >=60)

## 2021-05-31 LAB — CBC WITH DIFFERENTIAL/PLATELET
Absolute Monocytes: 376 cells/uL (ref 200–950)
Basophils Absolute: 40 cells/uL (ref 0–200)
Basophils Relative: 0.7 %
Eosinophils Absolute: 171 cells/uL (ref 15–500)
Eosinophils Relative: 3 %
HCT: 43 % (ref 35.0–45.0)
Hemoglobin: 14.6 g/dL (ref 11.7–15.5)
Lymphs Abs: 1322 cells/uL (ref 850–3900)
MCH: 30.8 pg (ref 27.0–33.0)
MCHC: 34 g/dL (ref 32.0–36.0)
MCV: 90.7 fL (ref 80.0–100.0)
MPV: 11.8 fL (ref 7.5–12.5)
Monocytes Relative: 6.6 %
Neutro Abs: 3791 cells/uL (ref 1500–7800)
Neutrophils Relative %: 66.5 %
Platelets: 240 10*3/uL (ref 140–400)
RBC: 4.74 10*6/uL (ref 3.80–5.10)
RDW: 13 % (ref 11.0–15.0)
Total Lymphocyte: 23.2 %
WBC: 5.7 10*3/uL (ref 3.8–10.8)

## 2021-05-31 LAB — TSH: TSH: 3.49 mIU/L (ref 0.40–4.50)

## 2021-05-31 LAB — HEPATITIS C ANTIBODY
Hepatitis C Ab: NONREACTIVE
SIGNAL TO CUT-OFF: <0.02 (ref <1.00)

## 2021-05-31 LAB — HEMOGLOBIN A1C
Hgb A1c MFr Bld: 5.7 % of total Hgb — ABNORMAL HIGH (ref <5.7)
Mean Plasma Glucose: 117 mg/dL
eAG (mmol/L): 6.5 mmol/L

## 2022-03-20 ENCOUNTER — Encounter: Payer: Self-pay | Admitting: Nurse Practitioner

## 2022-03-20 ENCOUNTER — Ambulatory Visit: Payer: PRIVATE HEALTH INSURANCE | Admitting: Nurse Practitioner

## 2022-03-20 DIAGNOSIS — M79672 Pain in left foot: Secondary | ICD-10-CM

## 2022-03-20 NOTE — Progress Notes (Signed)
Acute Office Visit  Subjective:     Patient ID: Holly Woodard, female    DOB: 1950/08/10, 71 y.o.   MRN: [id number]  Chief Complaint  Patient presents with   Sore on left foot    HPI Patient is in today for complaints of heel pain on left foot. Noticed recurrent episodes of pain present for several months but recently has noticed worsening of pain over the past 4-5 days. Patient is not taking anything for pain. States pain is not "bad enough" for medication.  At home, she has started floating heels over the past few days. Reports she wears comforable foot wear most of the time. Avoids heels.  Also reports very dry cracked skin on bilateral feet. Uses aquaphor on skin.  No concerns with right foot.   Of note, patient mentioned left heel pain during last wellness exam in March 2023.   Review of Systems  Constitutional:  Negative for chills and fever.  Neurological:  Negative for weakness.        Objective:    There were no vitals taken for this visit.   Physical Exam Constitutional:      General: She is not in acute distress. Pulmonary:     Effort: Pulmonary effort is normal.  Musculoskeletal:     Right foot: Bunion present.  Feet:     Right foot:     Skin integrity: Dry skin present. No erythema.     Toenail Condition: Fungal disease present.    Left foot:     Skin integrity: Dry skin present. No erythema.     Toenail Condition: Fungal disease present.    Comments:  Left foot with no open wounds or signs of infection.  Tenderness on palpation of left heel.  Neurological:     Mental Status: She is alert.     No results found for any visits on 03/20/22.      Assessment & Plan:   Problem List Items Addressed This Visit   None Visit Diagnoses     Pain of left heel    -  Primary     Recommended NSAIDS to help alleviate pain. Likely inflammatory process, no concerns for infection at this time. Continue floating heels and use of comfortable footwear.    Due to length of symptoms, I did recommend follow-up with PCP for further imaging/evaluation.   No orders of the defined types were placed in this encounter.   Return if symptoms worsen or fail to improve.  Lurena Joiner, NP

## 2022-05-15 ENCOUNTER — Encounter: Payer: Self-pay | Admitting: Podiatry

## 2022-05-15 ENCOUNTER — Ambulatory Visit: Payer: PRIVATE HEALTH INSURANCE

## 2022-05-15 ENCOUNTER — Ambulatory Visit: Payer: PRIVATE HEALTH INSURANCE | Admitting: Podiatry

## 2022-05-15 DIAGNOSIS — B351 Tinea unguium: Secondary | ICD-10-CM

## 2022-05-15 DIAGNOSIS — D2372 Other benign neoplasm of skin of left lower limb, including hip: Secondary | ICD-10-CM | POA: Diagnosis not present

## 2022-05-15 DIAGNOSIS — D2371 Other benign neoplasm of skin of right lower limb, including hip: Secondary | ICD-10-CM | POA: Diagnosis not present

## 2022-05-15 DIAGNOSIS — R234 Changes in skin texture: Secondary | ICD-10-CM

## 2022-05-15 DIAGNOSIS — L603 Nail dystrophy: Secondary | ICD-10-CM

## 2022-05-15 DIAGNOSIS — M79676 Pain in unspecified toe(s): Secondary | ICD-10-CM

## 2022-05-15 DIAGNOSIS — M722 Plantar fascial fibromatosis: Secondary | ICD-10-CM

## 2022-05-15 NOTE — Progress Notes (Signed)
Subjective:  Patient ID: Holly Woodard, female    DOB: [birth year],  MRN: [id number] HPI Chief Complaint  Patient presents with   Foot Pain    Plantar (edge) heel left - cracked skin x few weeks, very tender when PCP checked, been using [employer] foot cream and its helped and not too sore now   Nail Problem    Hallux bilateral (L>R) - thick and discolored nails x several months   Callouses    Plantar bilateral - callused areas   New Patient (Initial Visit)    72 y.o. female presents with the above complaint.   ROS: Denies fever chills nausea vomit muscle aches pains calf pain back pain chest pain shortness of breath.  Past Medical History:  Diagnosis Date   Arthritis    Degenerative disk    Breast cancer [facility])    Past Surgical History:  Procedure Laterality Date   BREAST LUMPECTOMY Left 04/25/2009    Current Outpatient Medications:    losartan (COZAAR) 50 MG tablet, 1 tablet Orally Once a day for 30 days, Disp: , Rfl:    calcium carbonate (TUMS EX) 750 MG chewable tablet, Chew 1 tablet by mouth daily., Disp: , Rfl:    cholecalciferol (VITAMIN D) 1000 UNITS tablet, Take 5,000 Units by mouth once a week. , Disp: , Rfl:    fexofenadine (ALLEGRA) 180 MG tablet, Take 180 mg by mouth daily., Disp: , Rfl:    rosuvastatin (CRESTOR) 5 MG tablet, Take 1 tablet (5 mg total) by mouth once a week., Disp: , Rfl:    Vitamin D, Ergocalciferol, (DRISDOL) 1.25 MG (50000 UNIT) CAPS capsule, TAKE 1 CAPSULE BY MOUTH ONE TIME PER WEEK for 28 days, Disp: , Rfl:   Allergies  Allergen Reactions   Atorvastatin Other (See Comments)   Review of Systems Objective:  There were no vitals filed for this visit.  General: Well developed, nourished, in no acute distress, alert and oriented x3   Dermatological: Skin is warm, dry and supple bilateral. Nails x 10 are well maintained; remaining integument appears unremarkable at this time. There are no open sores, no preulcerative lesions, no rash or  signs of infection present.  Benign skin fissures dry xerotic heels to midfoot bilateral.  Hallux nails are slightly thickened discolored distally particularly hallux left.  Benign skin lesions plantar aspect of the bilateral foot.  Vascular: Dorsalis Pedis artery and Posterior Tibial artery pedal pulses are 2/4 bilateral with immedate capillary fill time. Pedal hair growth present. No varicosities and no lower extremity edema present bilateral.   Neruologic: Grossly intact via light touch bilateral. Vibratory intact via tuning fork bilateral. Protective threshold with Semmes Wienstein monofilament intact to all pedal sites bilateral. Patellar and Achilles deep tendon reflexes 2+ bilateral. No Babinski or clonus noted bilateral.   Musculoskeletal: No gross boney pedal deformities bilateral. No pain, crepitus, or limitation noted with foot and ankle range of motion bilateral. Muscular strength 5/5 in all groups tested bilateral.  Mild hallux valgus deformity hammertoe deformities.  Gait: Unassisted, Nonantalgic.    Radiographs:  None taken  Assessment & Plan:   Assessment: Nail dystrophy hallux left.  Benign skin lesions bilateral.  Xerosis bilateral heel with early fissuring.  No open lesions noted mild hallux valgus mild hammertoes Plan: Samples of the nail were taken today.  We discussed emollient therapy for the heels to the dry skin discussed watching the second toe make sure that it does not start to overlap the hallux on the right  foot.  We will follow-up with her on an as-needed basis.  Arsenical should be back in about a month so we will have her to schedule appointment to come back then for discussion if necessary.     Macai Sisneros T. [place], [place]

## 2022-06-12 ENCOUNTER — Ambulatory Visit: Payer: PRIVATE HEALTH INSURANCE | Admitting: Nurse Practitioner

## 2022-06-12 ENCOUNTER — Encounter: Payer: Self-pay | Admitting: Nurse Practitioner

## 2022-06-12 VITALS — BP 157/88 | HR 64

## 2022-06-12 DIAGNOSIS — Z789 Other specified health status: Secondary | ICD-10-CM

## 2022-06-12 DIAGNOSIS — I10 Essential (primary) hypertension: Secondary | ICD-10-CM

## 2022-06-12 NOTE — Progress Notes (Signed)
[facility]- [facility]  Subjective:  Patient ID: Holly Woodard, female    DOB: 1951/02/22  Age: 72 y.o. MRN: [id number]  CC: wellness exam   HPI Holly Woodard presents for wellness exam visit for insurance benefit.  Patient has a PCP: Kathyrn Lass, last seen May 2023 PMH significant for: HTN on losartan, vit d def on supplementation, HLD on crestor.  Last labs per PCP were completed: Jan 2024 had lipid and BMP checked.   Health Maintenance:  Colonoscopy:June 27, 2017 was found to have bengin polyps. Due for repeat this year which she plans to schedule with [facility] Mammogram: June 03, 2022. No abnormality.  DEXA: August 2023- showed osteopenia.   Smoker: Never  Immunizations:  Shingrix-  completed in 2021.   Pneumovax -Prevenar in 2019  COVID- Has had 4 total.  Flu: completed. Tdap- up to date.   Lifestyle: Diet- Vegetarian for 55 years.   Exercise- no exercise.     Past Medical History:  Diagnosis Date   Arthritis    Degenerative disk    Breast cancer [facility])     Past Surgical History:  Procedure Laterality Date   BREAST LUMPECTOMY Left 04/25/2009    Outpatient Medications Prior to Visit  Medication Sig Dispense Refill   calcium carbonate (TUMS EX) 750 MG chewable tablet Chew 1 tablet by mouth daily.     fexofenadine (ALLEGRA) 180 MG tablet Take 180 mg by mouth daily.     losartan (COZAAR) 50 MG tablet 1 tablet Orally Once a day for 30 days     rosuvastatin (CRESTOR) 5 MG tablet Take 1 tablet (5 mg total) by mouth once a week. (Patient taking differently: Take 5 mg by mouth 2 (two) times a week.)     Vitamin D, Ergocalciferol, (DRISDOL) 1.25 MG (50000 UNIT) CAPS capsule TAKE 1 CAPSULE BY MOUTH ONE TIME PER WEEK for 28 days     cholecalciferol (VITAMIN D) 1000 UNITS tablet Take 5,000 Units by mouth once a week.      No facility-administered medications prior to visit.    ROS Review of Systems  Constitutional:  Negative for appetite change and  fatigue.  Respiratory:  Negative for shortness of breath and wheezing.   Cardiovascular:  Negative for chest pain, palpitations and leg swelling.  Gastrointestinal:  Negative for constipation, diarrhea, nausea and vomiting.  Endocrine: Negative for polydipsia and polyphagia.  Musculoskeletal:  Negative for joint swelling.  Neurological:  Negative for weakness and headaches.  Psychiatric/Behavioral:  Negative for sleep disturbance (no change in sleep pattern). The patient is not nervous/anxious.     Objective:  BP (!) 157/88   Pulse 64   SpO2 98%   Physical Exam Constitutional:      General: She is not in acute distress. HENT:     Head: Normocephalic.  Cardiovascular:     Rate and Rhythm: Normal rate and regular rhythm.     Pulses: Normal pulses.     Heart sounds: Normal heart sounds.  Pulmonary:     Effort: Pulmonary effort is normal. No respiratory distress.     Breath sounds: Normal breath sounds.  Abdominal:     General: Bowel sounds are normal.     Palpations: Abdomen is soft.  Musculoskeletal:        General: Normal range of motion.     Right lower leg: No edema.     Left lower leg: No edema.  Skin:    General: Skin is warm.  Neurological:  General: No focal deficit present.     Mental Status: She is alert and oriented to person, place, and time.  Psychiatric:        Mood and Affect: Mood normal.        Behavior: Behavior normal.      Assessment & Plan:    Holly Woodard was seen today for wellness exam.  Diagnoses and all orders for this visit:  Participant in health and wellness plan Adult wellness physical was conducted today. Importance of diet and exercise were discussed in detail.  We reviewed immunizations and gave recommendations regarding current immunization needed for age. Immunizations are up to date.  Preventative health exams needed: due for Colonoscopy. She plans to reach out to [facility] to schedule.   Patient was advised yearly wellness exam.  Will update labs today. Follow-up with PCP as recommended.   -     CBC with Differential -     Basic Metabolic Panel -     Vitamin D, 25-hydroxy -     TSH -     Hemoglobin A1c  Primary hypertension BP is elevated today despite taking losartan this morning. Patient is asymptomatic at this time. Instructed to monitor BP twice daily for the next week. If elevated readings persist patient will need to follow-up with PCP for further titration of antihypertensive. Patient verbalizes understanding.    Orders Placed This Encounter  Procedures   CBC with Differential   Basic Metabolic Panel   Vitamin D, 25-hydroxy   TSH   Hemoglobin A1c    No orders of the defined types were placed in this encounter.   Follow-up: as needed.

## 2022-06-13 LAB — CBC WITH DIFFERENTIAL/PLATELET
Absolute Monocytes: 394 cells/uL (ref 200–950)
Basophils Absolute: 29 cells/uL (ref 0–200)
Basophils Relative: 0.5 %
Eosinophils Absolute: 238 cells/uL (ref 15–500)
Eosinophils Relative: 4.1 %
HCT: 43.2 % (ref 35.0–45.0)
Hemoglobin: 14.6 g/dL (ref 11.7–15.5)
Lymphs Abs: 1438 cells/uL (ref 850–3900)
MCH: 30.7 pg (ref 27.0–33.0)
MCHC: 33.8 g/dL (ref 32.0–36.0)
MCV: 90.9 fL (ref 80.0–100.0)
MPV: 11.6 fL (ref 7.5–12.5)
Monocytes Relative: 6.8 %
Neutro Abs: 3700 cells/uL (ref 1500–7800)
Neutrophils Relative %: 63.8 %
Platelets: 216 10*3/uL (ref 140–400)
RBC: 4.75 10*6/uL (ref 3.80–5.10)
RDW: 13.1 % (ref 11.0–15.0)
Total Lymphocyte: 24.8 %
WBC: 5.8 10*3/uL (ref 3.8–10.8)

## 2022-06-13 LAB — TSH: TSH: 3.12 mIU/L (ref 0.40–4.50)

## 2022-06-13 LAB — VITAMIN D 25 HYDROXY (VIT D DEFICIENCY, FRACTURES): Vit D, 25-Hydroxy: 78 ng/mL (ref 30–100)

## 2022-06-13 LAB — BASIC METABOLIC PANEL
BUN: 11 mg/dL (ref 7–25)
CO2: 21 mmol/L (ref 20–32)
Calcium: 9.3 mg/dL (ref 8.6–10.4)
Chloride: 106 mmol/L (ref 98–110)
Creat: 0.6 mg/dL (ref 0.60–1.00)
Glucose, Bld: 100 mg/dL — ABNORMAL HIGH (ref 65–99)
Potassium: 4.1 mmol/L (ref 3.5–5.3)
Sodium: 138 mmol/L (ref 135–146)

## 2022-06-13 LAB — SPECIMEN COMPROMISED

## 2022-06-13 LAB — HEMOGLOBIN A1C
Hgb A1c MFr Bld: 5.9 % of total Hgb — ABNORMAL HIGH (ref <5.7)
Mean Plasma Glucose: 123 mg/dL
eAG (mmol/L): 6.8 mmol/L

## 2022-06-19 ENCOUNTER — Ambulatory Visit: Payer: PRIVATE HEALTH INSURANCE | Admitting: Podiatry

## 2022-06-19 ENCOUNTER — Encounter: Payer: Self-pay | Admitting: Podiatry

## 2022-06-19 DIAGNOSIS — L603 Nail dystrophy: Secondary | ICD-10-CM

## 2022-06-19 NOTE — Progress Notes (Signed)
She presents today for follow-up of her nail pathology.  Objective: Vital signs stable alert oriented x 3.  Pathology does demonstrate saprophytic fungus as well as nail dystrophy.  Assessment: Onychomycosis and nail dystrophy.  Plan: Discussed laser therapy and oral therapy at this point she would like to start laser therapy in the fall she will follow-up to make that functional at that time.

## 2023-05-11 ENCOUNTER — Other Ambulatory Visit: Payer: Self-pay | Admitting: Podiatry

## 2023-05-11 DIAGNOSIS — B351 Tinea unguium: Secondary | ICD-10-CM

## 2023-05-28 ENCOUNTER — Encounter: Payer: Self-pay | Admitting: Nurse Practitioner

## 2023-05-28 ENCOUNTER — Ambulatory Visit: Payer: PRIVATE HEALTH INSURANCE | Admitting: Nurse Practitioner

## 2023-05-28 VITALS — BP 144/78 | HR 70 | Temp 98.2°F

## 2023-05-28 DIAGNOSIS — M79672 Pain in left foot: Secondary | ICD-10-CM

## 2023-05-28 DIAGNOSIS — Z789 Other specified health status: Secondary | ICD-10-CM

## 2023-05-28 MED ORDER — NAPROXEN 500 MG PO TABS: 500.0000 mg | ORAL_TABLET | Freq: Two times a day (BID) | ORAL | 0 refills | Status: AC

## 2023-05-28 NOTE — Progress Notes (Signed)
 Occupational Health- [facility]  Subjective:  Patient ID: Holly Woodard, female    DOB: [birth year]  Age: 73 y.o. MRN: [id number]  CC: Wellness Exam    HPI Holly Woodard presents for wellness exam visit for insurance benefit.  Patient has a PCP: Dr. Hyacinth Meeker PMH significant for: HLD, HTN, Prediabetes Last labs per PCP were completed: March 2024  Health Maintenance:  Colonoscopy: Spetember 9, 2024- has 3 benign polyps, repeat every 5 years.  Mammogram: March 2024, has one scheduled for March 2025    Smoker:Never  Immunizations:  Shingrix-   Completed Flu- completed Oct 2024 Prevenar- completed  Tdap- reports up to date.   Lifestyle: Diet- Vegetarian, admits she hasn't been as healthy, admits to stress eating a bit.  Exercise- in the past 4 months has been walking (30 min) and strength training (15-20 min) 3 times a week  is participating in a exercise study through [facility].  Acute concern:  Reports over several months has had pain and burning on top of left foot. No concerns with left foot. Pain is worse with activity. Feels like she might be limping a bit. No injury or trauma to site. Has not noticed any swelling on site.  Has not taking anything otc for this. She likes to avoid medications when possible. 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 States this feels different to this.      Past Medical History:  Diagnosis Date   Arthritis    Degenerative disk    Breast cancer [facility])     Past Surgical History:  Procedure Laterality Date   BREAST LUMPECTOMY Left 04/25/2009    Outpatient Medications Prior to Visit  Medication Sig Dispense Refill   calcium carbonate (TUMS EX) 750 MG chewable tablet Chew 1 tablet by mouth daily as needed.     fexofenadine (ALLEGRA) 180 MG tablet Take 180 mg by mouth daily.     losartan (COZAAR) 50 MG tablet 1 tablet Orally Once a day for 30 days     Multiple Vitamin (MULTIVITAMIN) tablet Take 1 tablet by mouth once a week.      rosuvastatin (CRESTOR) 5 MG tablet Take 1 tablet (5 mg total) by mouth once a week. (Patient taking differently: Take 5 mg by mouth 2 (two) times a week.)     Vitamin D, Ergocalciferol, (DRISDOL) 1.25 MG (50000 UNIT) CAPS capsule TAKE 1 CAPSULE BY MOUTH ONE TIME PER WEEK for 28 days     No facility-administered medications prior to visit.    ROS Review of Systems  Constitutional:  Negative for activity change and unexpected weight change.  HENT:  Negative for hearing loss.   Eyes:  Negative for visual disturbance.  Respiratory:  Negative for shortness of breath.   Cardiovascular:  Negative for chest pain.  Gastrointestinal:  Negative for constipation, diarrhea, nausea and vomiting.  Musculoskeletal:  Negative for arthralgias and back pain.       Foot pain- top foot  Neurological:  Negative for dizziness and headaches.    Objective:  BP (!) 144/78 (BP Location: Right Arm, Patient Position: Sitting, Cuff Size: Normal)   Pulse 70   Temp 98.2 F (36.8 C)   SpO2 98%   Physical Exam Constitutional:      General: She is not in acute distress. HENT:     Head: Normocephalic.     Right Ear: Tympanic membrane, ear canal and external ear normal.     Left Ear: Tympanic membrane, ear canal and external ear  normal.     Mouth/Throat:     Mouth: Mucous membranes are moist.     Pharynx: Oropharynx is clear. No oropharyngeal exudate.  Eyes:     Pupils: Pupils are equal, round, and reactive to light.  Cardiovascular:     Rate and Rhythm: Normal rate and regular rhythm.     Heart sounds: Normal heart sounds.  Pulmonary:     Effort: Pulmonary effort is normal.     Breath sounds: Normal breath sounds.  Abdominal:     General: Bowel sounds are normal.     Palpations: Abdomen is soft.  Musculoskeletal:        General: Normal range of motion.     Right lower leg: No edema.     Left lower leg: No edema.     Right foot: Normal range of motion.     Left foot: Normal range of motion.  Feet:      Right foot:     Toenail Condition: Fungal disease present.    Left foot:     Toenail Condition: Fungal disease present.    Comments: Right foot hammer toe. Left foot- skin is clear, non erythematous, no swelling to top of foot noted.  Neurological:     General: No focal deficit present.     Mental Status: She is alert and oriented to person, place, and time.      Assessment & Plan:    Holly Woodard was seen today for wellness exam .  Diagnoses and all orders for this visit:  Participant in health and wellness plan Adult wellness physical was conducted today. Importance of diet and exercise were discussed in detail.  We reviewed immunizations and gave recommendations regarding current immunization needed for age.  Preventative health exams are up to date. Will update labs.   Patient was advised yearly wellness exam  -     COMPLETE METABOLIC PANEL WITH eGFR -     Vitamin B12 -     Vitamin D, 25-hydroxy -     CBC with Differential -     Hemoglobin A1c -     Lipid Panel  Left foot pain Suspect tendinitis but plan to check labs including vit b12 and A1c to rule out neuropathy. Also recommended short term use of NSAID. Plan for naproxen 500mg  BID with meals x 7 days then as needed.   -     naproxen (NAPROSYN) 500 MG tablet; Take 1 tablet (500 mg total) by mouth 2 (two) times daily with a meal for 10 days.    Orders Placed This Encounter  Procedures   COMPLETE METABOLIC PANEL WITH eGFR   Vitamin B12   Vitamin D, 25-hydroxy   CBC with Differential   Hemoglobin A1c   Lipid Panel    Meds ordered this encounter  Medications   naproxen (NAPROSYN) 500 MG tablet    Sig: Take 1 tablet (500 mg total) by mouth 2 (two) times daily with a meal for 10 days.    Dispense:  20 tablet    Refill:  0    Supervising Provider:   Erasmo Downer [[id number]]    Follow-up: follow-up as needed.

## 2023-06-01 LAB — CBC WITH DIFFERENTIAL/PLATELET
Absolute Lymphocytes: 1336 {cells}/uL (ref 850–3900)
Absolute Monocytes: 398 {cells}/uL (ref 200–950)
Basophils Absolute: 32 {cells}/uL (ref 0–200)
Basophils Relative: 0.6 %
Eosinophils Absolute: 191 {cells}/uL (ref 15–500)
Eosinophils Relative: 3.6 %
HCT: 41 % (ref 35.0–45.0)
Hemoglobin: 13.9 g/dL (ref 11.7–15.5)
MCH: 30.8 pg (ref 27.0–33.0)
MCHC: 33.9 g/dL (ref 32.0–36.0)
MCV: 90.9 fL (ref 80.0–100.0)
MPV: 11.5 fL (ref 7.5–12.5)
Monocytes Relative: 7.5 %
Neutro Abs: 3344 {cells}/uL (ref 1500–7800)
Neutrophils Relative %: 63.1 %
Platelets: 207 10*3/uL (ref 140–400)
RBC: 4.51 10*6/uL (ref 3.80–5.10)
RDW: 12.5 % (ref 11.0–15.0)
Total Lymphocyte: 25.2 %
WBC: 5.3 10*3/uL (ref 3.8–10.8)

## 2023-06-01 LAB — COMPLETE METABOLIC PANEL WITHOUT GFR
AG Ratio: 2.2 (calc) (ref 1.0–2.5)
ALT: 20 U/L (ref 6–29)
AST: 21 U/L (ref 10–35)
Albumin: 4.3 g/dL (ref 3.6–5.1)
Alkaline phosphatase (APISO): 62 U/L (ref 37–153)
BUN: 15 mg/dL (ref 7–25)
CO2: 24 mmol/L (ref 20–32)
Calcium: 9 mg/dL (ref 8.6–10.4)
Chloride: 108 mmol/L (ref 98–110)
Creat: 0.61 mg/dL (ref 0.60–1.00)
Globulin: 2 g/dL (ref 1.9–3.7)
Glucose, Bld: 104 mg/dL — ABNORMAL HIGH (ref 65–99)
Potassium: 4.4 mmol/L (ref 3.5–5.3)
Sodium: 137 mmol/L (ref 135–146)
Total Bilirubin: 0.8 mg/dL (ref 0.2–1.2)
Total Protein: 6.3 g/dL (ref 6.1–8.1)
eGFR: 95 mL/min/1.73m2

## 2023-06-01 LAB — LIPID PANEL
Cholesterol: 172 mg/dL
HDL: 54 mg/dL
LDL Cholesterol (Calc): 98 mg/dL
Non-HDL Cholesterol (Calc): 118 mg/dL
Total CHOL/HDL Ratio: 3.2 (calc)
Triglycerides: 108 mg/dL

## 2023-06-01 LAB — HEMOGLOBIN A1C
Hgb A1c MFr Bld: 5.7 %{Hb} — ABNORMAL HIGH
Mean Plasma Glucose: 117 mg/dL
eAG (mmol/L): 6.5 mmol/L

## 2023-06-01 LAB — VITAMIN D 25 HYDROXY (VIT D DEFICIENCY, FRACTURES): Vit D, 25-Hydroxy: 71 ng/mL (ref 30–100)

## 2023-06-01 LAB — VITAMIN B12: Vitamin B-12: 229 pg/mL (ref 200–1100)

## 2023-06-05 ENCOUNTER — Ambulatory Visit (INDEPENDENT_AMBULATORY_CARE_PROVIDER_SITE_OTHER): Payer: Self-pay

## 2023-06-05 DIAGNOSIS — L603 Nail dystrophy: Secondary | ICD-10-CM

## 2023-06-05 NOTE — Patient Instructions (Signed)

## 2023-06-05 NOTE — Progress Notes (Signed)
 Patient presents today for the 1 laser treatment. Diagnosed with mycotic nail infection by Dr. Al Corpus.   Toenail most affected 1st hallux bilaterally.  All other systems are negative. Patient does report sensitivity in her toes at times, so I did turn down the filing tool.   Nails were filed thin. Laser therapy was administered to 1st toenails bilaterally and patient tolerated the treatment well. All safety precautions were in place.   Single laser pass was done on non-affected nails.   Follow up in 4 weeks for laser # 2.

## 2023-06-12 ENCOUNTER — Encounter: Payer: Self-pay | Admitting: Hematology and Oncology

## 2023-07-10 ENCOUNTER — Ambulatory Visit (INDEPENDENT_AMBULATORY_CARE_PROVIDER_SITE_OTHER): Payer: PRIVATE HEALTH INSURANCE | Admitting: *Deleted

## 2023-07-10 DIAGNOSIS — B351 Tinea unguium: Secondary | ICD-10-CM

## 2023-07-10 DIAGNOSIS — L603 Nail dystrophy: Secondary | ICD-10-CM

## 2023-07-10 NOTE — Progress Notes (Signed)
 Patient presents today for the 2nd laser treatment. Diagnosed with mycotic nail infection by Dr. Al Corpus.   Toenail most affected 1st hallux bilaterally.  All other systems are negative.   Patient does report sensitivity in her toes since having chemo treatments. She does have an injury to the hallux right since July 2024 from dropping an iPad on her toe. The nail is bruised and has grown out about halfway.  Nails were filed thin. Laser therapy was administered to 1st toenails bilaterally and patient tolerated the treatment well. All safety precautions were in place.   I was able to trim and file the bruised area off the hallux right today. She was very happy with how its looking.  Follow up in 4 weeks for laser # 3

## 2023-08-14 ENCOUNTER — Ambulatory Visit (INDEPENDENT_AMBULATORY_CARE_PROVIDER_SITE_OTHER): Payer: PRIVATE HEALTH INSURANCE | Admitting: Podiatrist

## 2023-08-14 DIAGNOSIS — L603 Nail dystrophy: Secondary | ICD-10-CM

## 2023-08-14 NOTE — Progress Notes (Signed)
 Patient presents today for the 3rd laser treatment. Diagnosed with mycotic nail infection by Dr. Lara Plants.   Toenail most affected 1st hallux bilaterally.  All other systems are negative.   Patient does report sensitivity in her toes since having chemo treatments. She does have an injury to the hallux right since July 2024 from dropping an iPad on her toe. The distal portion of the nail is growing out nicely.  Nails were filed thin. Laser therapy was administered to 1st toenails bilaterally and second toe right and patient tolerated the treatment well. All safety precautions were in place.    Follow up in 4- 6 weeks for laser # 4

## 2023-09-10 ENCOUNTER — Ambulatory Visit: Payer: PRIVATE HEALTH INSURANCE | Admitting: Nurse Practitioner

## 2023-09-10 ENCOUNTER — Encounter: Payer: Self-pay | Admitting: Nurse Practitioner

## 2023-09-10 VITALS — BP 122/76 | HR 62 | Temp 97.2°F

## 2023-09-10 DIAGNOSIS — T148XXA Other injury of unspecified body region, initial encounter: Secondary | ICD-10-CM

## 2023-09-10 NOTE — Progress Notes (Addendum)
 Acute Office Visit  Subjective:     Patient ID: Holly Woodard, female    DOB: 1950-10-10, 73 y.o.   MRN: [id number]  Chief Complaint  Patient presents with   Abrasion    HPI Patient is in today for skin abrasion that occurred after a fall  one week ago. She coming down stairs and skin abrasion occurred on right dorsal forearm after hitting wooden step. Denies fevers or chills. Has used topical antibiotics x 2 days. Has kept wound both covered and uncovered. Has mild pain to site 1/10.  Reports redness around site of abrasion. Unsure if she needs antibiotics.   Last Tdap: 06/26/20  Review of Systems  Constitutional:  Negative for chills and fever.  Respiratory:  Negative for shortness of breath.   Cardiovascular:  Negative for chest pain.        Objective:    BP 122/76 (BP Location: Right Arm, Patient Position: Sitting, Cuff Size: Normal)   Pulse 62   Temp (!) 97.2 F (36.2 C)   SpO2 97%    Physical Exam Constitutional:      General: She is not in acute distress. HENT:     Head: Normocephalic.   Cardiovascular:     Rate and Rhythm: Normal rate.  Pulmonary:     Effort: Pulmonary effort is normal.   Skin:    Findings: Abrasion present.         Comments: Nickel size skin abrasion noted to right forearm. No drainage or bleeding to site noted. Granulation tissue and some slough noted.    Neurological:     Mental Status: She is alert.     No results found for any visits on 09/10/23.      Assessment & Plan:   Problem List Items Addressed This Visit   None Visit Diagnoses       Skin abrasion    -  Primary  Topical antibiotic ointment was applied and site was covered with nonadherent dressing. No concerns for skin infection at this time but will re-evaluate next week on Tuesday if site has not improved. Discussed red flag symptoms and when to seek care.      No orders of the defined types were placed in this encounter.   As needed.   Zacaria Pousson Flores  Talyn Dessert, NP

## 2023-10-23 ENCOUNTER — Other Ambulatory Visit: Payer: PRIVATE HEALTH INSURANCE

## 2023-11-06 ENCOUNTER — Other Ambulatory Visit: Payer: PRIVATE HEALTH INSURANCE

## 2023-11-20 ENCOUNTER — Ambulatory Visit (INDEPENDENT_AMBULATORY_CARE_PROVIDER_SITE_OTHER): Payer: PRIVATE HEALTH INSURANCE

## 2023-11-20 DIAGNOSIS — L603 Nail dystrophy: Secondary | ICD-10-CM

## 2023-11-20 NOTE — Progress Notes (Signed)
 Patient presents today for the 4th  laser treatment. Diagnosed with mycotic nail infection by Dr. Verta.    Toenail most affected 1st hallux bilaterally.   All other systems are negative.    Patient does report sensitivity in her toes since having chemo treatments. The distal portion of the nail is growing out nicely.   Nails were filed thin. Laser therapy was administered to 1st toenails bilaterally and second toe right and patient tolerated the treatment well. All safety precautions were in place.      Follow up in 4- 6 weeks for laser # 5

## 2024-01-01 ENCOUNTER — Other Ambulatory Visit: Payer: PRIVATE HEALTH INSURANCE

## 2024-01-07 ENCOUNTER — Ambulatory Visit: Payer: PRIVATE HEALTH INSURANCE | Admitting: Nurse Practitioner

## 2024-01-07 ENCOUNTER — Encounter: Payer: Self-pay | Admitting: Nurse Practitioner

## 2024-01-07 VITALS — BP 122/72 | HR 56 | Temp 98.0°F

## 2024-01-07 DIAGNOSIS — J069 Acute upper respiratory infection, unspecified: Secondary | ICD-10-CM

## 2024-01-07 MED ORDER — BENZONATATE 100 MG PO CAPS: 100.0000 mg | ORAL_CAPSULE | Freq: Three times a day (TID) | ORAL | 0 refills | Status: DC | PRN

## 2024-01-07 MED ORDER — ALBUTEROL SULFATE HFA 108 (90 BASE) MCG/ACT IN AERS: 2.0000 | INHALATION_SPRAY | Freq: Four times a day (QID) | RESPIRATORY_TRACT | 1 refills | Status: DC | PRN

## 2024-01-07 NOTE — Progress Notes (Signed)
 Acute Office Visit  Subjective:     Patient ID: Holly Woodard, female    DOB: February 05, 1951, 73 y.o.   MRN: [id number]  Chief Complaint  Patient presents with   Cough    HPI Patient is in today for productive cough and congestion.  Symptoms started x1 week with a runny nose. Tested negative for COVID. Denies fevers, chills, myalgias.  Has taken dayquil at home for symptom relief.  Hx of environmental allergies, takes allegra daily but reports she missed 2 doses a few days ago.  Reports wheezing after rushing/ walking long distance.  Review of Systems  Constitutional:  Negative for chills, fever and malaise/fatigue.  HENT:  Positive for congestion. Negative for ear discharge, ear pain, sinus pain and sore throat.   Respiratory:  Positive for sputum production, shortness of breath (SOB with activity when rushing) and wheezing (reports mid chest rattle). Negative for cough.   Cardiovascular:  Negative for chest pain.  Gastrointestinal: Negative.   Musculoskeletal:  Negative for myalgias.  Neurological:  Negative for headaches.       Objective:    BP 122/72 (BP Location: Right Arm, Patient Position: Sitting, Cuff Size: Normal)   Pulse (!) 56   Temp 98 F (36.7 C) (Oral)   SpO2 97%    Physical Exam Constitutional:      General: She is not in acute distress. HENT:     Head: Normocephalic.     Right Ear: Tympanic membrane, ear canal and external ear normal.     Left Ear: Tympanic membrane, ear canal and external ear normal.     Nose: Congestion present. No rhinorrhea.     Right Sinus: No maxillary sinus tenderness or frontal sinus tenderness.     Left Sinus: No maxillary sinus tenderness or frontal sinus tenderness.     Mouth/Throat:     Mouth: Mucous membranes are moist.     Pharynx: Oropharynx is clear. No oropharyngeal exudate or posterior oropharyngeal erythema.     Tonsils: No tonsillar exudate.  Eyes:     Pupils: Pupils are equal, round, and reactive to light.   Cardiovascular:     Rate and Rhythm: Normal rate and regular rhythm.     Heart sounds: Normal heart sounds.  Pulmonary:     Effort: No respiratory distress.     Breath sounds: Rhonchi present.  Musculoskeletal:        General: Normal range of motion.  Neurological:     General: No focal deficit present.     Mental Status: She is alert and oriented to person, place, and time. Mental status is at baseline.     No results found for any visits on 01/07/24.      Assessment & Plan:   Problem List Items Addressed This Visit   None Visit Diagnoses       Viral upper respiratory tract infection    -  Primary   Relevant Medications   benzonatate (TESSALON) 100 MG capsule   albuterol (VENTOLIN HFA) 108 (90 Base) MCG/ACT inhaler  Recommended supportive care meaures including mucinex, tessalon perles, and albuterol inhaler as needed. Increase fluid intake.  Discussed red flag symptoms and when to seek care.Follow-up if symptoms worsen or fail to improve.      Meds ordered this encounter  Medications   benzonatate (TESSALON) 100 MG capsule    Sig: Take 1 capsule (100 mg total) by mouth 3 (three) times daily as needed for cough.    Dispense:  20 capsule  Refill:  0    Supervising Provider:   BACIGALUPO, ANGELA M [[id number]]   albuterol (VENTOLIN HFA) 108 (90 Base) MCG/ACT inhaler    Sig: Inhale 2 puffs into the lungs every 6 (six) hours as needed for wheezing or shortness of breath.    Dispense:  8 g    Refill:  1    Supervising Provider:   BACIGALUPO, ANGELA M [[id number]]    As needed.  Katelin Kutsch Flores Oprah Camarena, NP

## 2024-04-21 ENCOUNTER — Ambulatory Visit: Payer: PRIVATE HEALTH INSURANCE | Admitting: Podiatry

## 2024-04-21 DIAGNOSIS — D2372 Other benign neoplasm of skin of left lower limb, including hip: Secondary | ICD-10-CM

## 2024-04-21 DIAGNOSIS — D2371 Other benign neoplasm of skin of right lower limb, including hip: Secondary | ICD-10-CM

## 2024-04-21 DIAGNOSIS — B351 Tinea unguium: Secondary | ICD-10-CM | POA: Diagnosis not present

## 2024-04-21 DIAGNOSIS — M79676 Pain in unspecified toe(s): Secondary | ICD-10-CM | POA: Diagnosis not present

## 2024-04-25 NOTE — Progress Notes (Signed)
 She presents today with chief concern painful corn subfifth met head and base left foot.  She had laser performed for her toenail fungus which she really does not think worked very well.  She says the right great toe has grown out and looks better and the left 1 is still mostly discolored.  Objective: Vital signs are stable alert and oriented x 3 pulses are palpable.  Hallux nail right appears to be growing out nicely the left 1 is still discolored dystrophic.  More than likely nail damage.  Also demonstrates painful plantar lateral benign skin lesions.  They are not preulcerative they were debrided today.  Assessment: Painful skin lesions subfifth metatarsal head left.  Plan: Debridement of benign skin lesions.  Follow-up with her as needed
